# Patient Record
Sex: Female | Born: 1975
Health system: Southern US, Community
[De-identification: ages and names within clinical notes are randomized; demographics above are authoritative.]

## PROBLEM LIST (undated history)

## (undated) DIAGNOSIS — L719 Rosacea, unspecified: Secondary | ICD-10-CM

## (undated) DIAGNOSIS — F419 Anxiety disorder, unspecified: Secondary | ICD-10-CM

## (undated) DIAGNOSIS — F329 Major depressive disorder, single episode, unspecified: Secondary | ICD-10-CM

## (undated) HISTORY — DX: Rosacea, unspecified: L71.9

## (undated) HISTORY — DX: Anxiety disorder, unspecified: F41.9

## (undated) HISTORY — PX: WISDOM TOOTH EXTRACTION: SHX21

## (undated) HISTORY — DX: Major depressive disorder, single episode, unspecified: F32.9

---

## 2003-04-24 DIAGNOSIS — F419 Anxiety disorder, unspecified: Secondary | ICD-10-CM

## 2003-04-24 DIAGNOSIS — F32A Depression, unspecified: Secondary | ICD-10-CM

## 2003-04-24 HISTORY — DX: Anxiety disorder, unspecified: F41.9

## 2003-04-24 HISTORY — DX: Depression, unspecified: F32.A

## 2011-09-12 ENCOUNTER — Other Ambulatory Visit (HOSPITAL_COMMUNITY): Payer: Self-pay | Admitting: Specialist

## 2011-09-12 DIAGNOSIS — N949 Unspecified condition associated with female genital organs and menstrual cycle: Secondary | ICD-10-CM

## 2011-09-18 ENCOUNTER — Ambulatory Visit (HOSPITAL_COMMUNITY)
Admission: RE | Admit: 2011-09-18 | Discharge: 2011-09-18 | Disposition: A | Discharge status: Home / Self Care | Payer: BC Managed Care – PPO | Source: Ambulatory Visit | Attending: Specialist | Admitting: Specialist

## 2011-09-18 DIAGNOSIS — N979 Female infertility, unspecified: Secondary | ICD-10-CM | POA: Insufficient documentation

## 2011-09-18 DIAGNOSIS — N949 Unspecified condition associated with female genital organs and menstrual cycle: Secondary | ICD-10-CM

## 2011-09-18 MED ORDER — IOHEXOL 300 MG/ML  SOLN
4.0000 mL | Freq: Once | INTRAMUSCULAR | Status: AC | PRN
  Administered 2011-09-18: 4 mL

## 2012-10-06 ENCOUNTER — Ambulatory Visit (INDEPENDENT_AMBULATORY_CARE_PROVIDER_SITE_OTHER): Payer: BC Managed Care – PPO | Admitting: Gynecology

## 2012-10-06 ENCOUNTER — Encounter: Payer: Self-pay | Admitting: Gynecology

## 2012-10-06 VITALS — BP 112/74 | HR 78 | Resp 14 | Ht 64.0 in | Wt 121.0 lb

## 2012-10-06 DIAGNOSIS — Z309 Encounter for contraceptive management, unspecified: Secondary | ICD-10-CM

## 2012-10-06 DIAGNOSIS — Z124 Encounter for screening for malignant neoplasm of cervix: Secondary | ICD-10-CM

## 2012-10-06 DIAGNOSIS — Z01419 Encounter for gynecological examination (general) (routine) without abnormal findings: Secondary | ICD-10-CM

## 2012-10-06 MED ORDER — ESTRADIOL VALERATE-DIENOGEST 3/2-2/2-3/1 MG PO TABS: 1.0000 | ORAL_TABLET | Freq: Every day | ORAL | Status: DC

## 2012-10-06 NOTE — Patient Instructions (Signed)

## 2012-10-06 NOTE — Progress Notes (Signed)
37 y.o. Married Caucasian female   G0P0000 here for annual exam. Pt is currently sexually active.  Pt reports history of female factor infertility, is on oc to control dysmenorrhea and acne, happy with current ocp-on for 4y.  Patient's last menstrual period was 09/26/2012.          Sexually active: yes  The current method of family planning is OCP (estrogen/progesterone).    Exercising: yes  waking qd; aerobics 3-5x/wk Last pap: Summer 2013 Abnormal: Last Summer might be ASCUS? Alcohol: 4 drinks/wk  Tobacco: no BSE: tries to    No health maintenance topics applied.  Family History  Problem Relation Age of Onset  . Hypertension Mother   . Thyroid disease Mother   . Heart attack Father   . Osteoporosis Maternal Grandmother     There are no active problems to display for this patient.   Past Medical History  Diagnosis Date  . Anxiety   . Depression     Past Surgical History  Procedure Laterality Date  . Wisdom tooth extraction      college    Allergies: Doxycycline  Current Outpatient Prescriptions  Medication Sig Dispense Refill  . ampicillin (PRINCIPEN) 500 MG capsule Take 500 mg by mouth 4 (four) times daily.      Marland Kitchen ipratropium (ATROVENT) 0.06 % nasal spray Place 2 sprays into the nose 4 (four) times daily.      Marland Kitchen levocetirizine (XYZAL) 5 MG tablet Take 5 mg by mouth every evening.      . montelukast (SINGULAIR) 10 MG tablet Take 10 mg by mouth at bedtime.      . nitrofurantoin (MACRODANTIN) 50 MG capsule Take 50 mg by mouth as needed.      . triamcinolone (NASACORT) 55 MCG/ACT nasal inhaler Place 2 sprays into the nose daily.      Marland Kitchen UNABLE TO FIND once a week. Med Name: allergy shots       No current facility-administered medications for this visit.    ROS: Pertinent items are noted in HPI.  Exam:    BP 112/74  Pulse 78  Resp 14  Ht 5\' 4"  (1.626 m)  Wt 121 lb (54.885 kg)  BMI 20.76 kg/m2  LMP 09/26/2012 Weight change: @WEIGHTCHANGE @ Last 3 height  recordings:  Ht Readings from Last 3 Encounters:  10/06/12 5\' 4"  (1.626 m)   General appearance: alert, cooperative and appears stated age Head: Normocephalic, without obvious abnormality, atraumatic Neck: no adenopathy, no carotid bruit, no JVD, supple, symmetrical, trachea midline and thyroid not enlarged, symmetric, no tenderness/mass/nodules Lungs: clear to auscultation bilaterally Breasts: normal appearance, no masses or tenderness Heart: regular rate and rhythm, S1, S2 normal, no murmur, click, rub or gallop Abdomen: soft, non-tender; bowel sounds normal; no masses,  no organomegaly Extremities: extremities normal, atraumatic, no cyanosis or edema Skin: Skin color, texture, turgor normal. No rashes or lesions Lymph nodes: Cervical, supraclavicular, and axillary nodes normal. no inguinal nodes palpated Neurologic: Grossly normal   Pelvic: External genitalia:  no lesions              Urethra: normal appearing urethra with no masses, tenderness or lesions              Bartholins and Skenes: Bartholin's, Urethra, Skene's normal                 Vagina: normal appearing vagina with normal color and discharge, no lesions  Cervix: normal appearance              Pap taken: yes        Bimanual Exam:  Uterus:  uterus is normal size, shape, consistency and nontender                                      Adnexa:    normal adnexa in size, nontender and no masses                                      Rectovaginal: Confirms                                      Anus:  normal sphincter tone, no lesions  A: well woman     P: mammogram baseline at 41 pap smear guidelines reviewed return annually or prn   An After Visit Summary was printed and given to the patient.

## 2013-02-26 ENCOUNTER — Other Ambulatory Visit: Payer: Self-pay

## 2013-07-28 ENCOUNTER — Telehealth: Payer: Self-pay | Admitting: Gynecology

## 2013-07-28 ENCOUNTER — Encounter: Payer: Self-pay | Admitting: Gynecology

## 2013-07-28 ENCOUNTER — Ambulatory Visit (INDEPENDENT_AMBULATORY_CARE_PROVIDER_SITE_OTHER): Payer: BC Managed Care – PPO | Admitting: Gynecology

## 2013-07-28 VITALS — BP 110/76 | Resp 18 | Ht 64.0 in | Wt 118.0 lb

## 2013-07-28 DIAGNOSIS — R590 Localized enlarged lymph nodes: Secondary | ICD-10-CM

## 2013-07-28 DIAGNOSIS — R599 Enlarged lymph nodes, unspecified: Secondary | ICD-10-CM

## 2013-07-28 NOTE — Telephone Encounter (Signed)
Patient found a breast lump and would like appointment ASAP.

## 2013-07-28 NOTE — Telephone Encounter (Signed)
Spoke with patient. Patient states that she noticed a small lump that is about the size of a pea under her left axilla. First noticed around a month ago and is concerned since it has not gone away. States it has not gotten any larger or smaller within the month. Denies tenderness and pain in or around area. Offered 3 appointment times today with Dr.Lathrop but patient declines stating that she is really busy with work today and lives out of town. Requesting appointment tomorrow morning. Advised Dr.Lathrop will be in surgery Wednesday morning. Appointment made for 9:30 on 4/8 with Dr.Silva. Patient agreeable and verbalizes understanding.  Routing to provider for final review. Patient agreeable to disposition. Will close encounter

## 2013-07-28 NOTE — Telephone Encounter (Signed)
Message left to return call to Amanda Stanley at [phone].    

## 2013-07-28 NOTE — Progress Notes (Signed)
Subjective:     Patient ID: Amanda WaltersArlie Stanley, female   DOB: 1975/05/23, 38 y.o.   MRN: [id number]  HPI Comments: Pt reports lump in left axilla that she noticed aobut 9237m ago when she was shaving.  Pt reports doing breast exams routinely and has not noticed anything in the breast.  She denies any recent URTI or infection on left.  Overall, she feels well.    Review of Systems Per HPI    Objective:   Physical Exam  Constitutional: She is oriented to person, place, and time. She appears well-developed and well-nourished.  Pulmonary/Chest: Right breast exhibits no mass, no nipple discharge, no skin change and no tenderness. Left breast exhibits no inverted nipple, no mass, no nipple discharge, no skin change and no tenderness. Breasts are symmetrical.    Neurological: She is alert and oriented to person, place, and time.       Assessment:     Left axillary LN     Plan:     Pt assured re size and mobility Pt states that she has been getting allergy shots in left upper arm recently for seasonal allergy Will reassess at annual due 6/15  Agrees to plan

## 2013-07-29 ENCOUNTER — Ambulatory Visit: Payer: Self-pay | Admitting: Obstetrics and Gynecology

## 2013-10-14 ENCOUNTER — Other Ambulatory Visit: Payer: Self-pay | Admitting: Gynecology

## 2013-10-14 NOTE — Telephone Encounter (Signed)
[facility] on NATAZIA Last filled - 10/06/12, #1 X 11 Last AEX - 10/06/12 Next AEX - 10/27/13 RX sent until AEX.

## 2013-10-27 ENCOUNTER — Ambulatory Visit (INDEPENDENT_AMBULATORY_CARE_PROVIDER_SITE_OTHER): Payer: BC Managed Care – PPO | Admitting: Gynecology

## 2013-10-27 ENCOUNTER — Encounter: Payer: Self-pay | Admitting: Gynecology

## 2013-10-27 VITALS — BP 100/60 | HR 66 | Resp 12 | Ht 64.0 in | Wt 118.0 lb

## 2013-10-27 DIAGNOSIS — Z3041 Encounter for surveillance of contraceptive pills: Secondary | ICD-10-CM

## 2013-10-27 DIAGNOSIS — Z01419 Encounter for gynecological examination (general) (routine) without abnormal findings: Secondary | ICD-10-CM

## 2013-10-27 MED ORDER — ESTRADIOL VALERATE-DIENOGEST 3/2-2/2-3/1 MG PO TABS: 1.0000 | ORAL_TABLET | Freq: Every day | ORAL | Status: DC

## 2013-10-27 NOTE — Progress Notes (Signed)
38 y.o. Married Caucasian female   G0P0000 here for annual exam. Pt is currently sexually active.  On ocp for regulation of cycle, history of female factor infertility.  Pt happy with ocp.  On macrodantin for post-coital cystitis-[facility].  LN in axilla resolved. No new complaints  Patient's last menstrual period was 10/26/2013.          Sexually active: Yes.    The current method of family planning is OCP (estrogen/progesterone).    Exercising: Yes.    yoga,spin, zumba, swim, step, aerobics qd  Last pap: 10/06/12 NEG HR HPV  Alcohol: 5-6 Drinks/wk Tobacco: no BSE: no  Hgb: Declined ; Urine: Declined    Health Maintenance  Topic Date Due  . Tetanus/tdap  04/05/1996  . Influenza Vaccine  11/21/2013  . Pap Smear  10/07/2015    Family History  Problem Relation Age of Onset  . Hypertension Mother   . Thyroid disease Mother   . Heart attack Father   . Osteoporosis Maternal Grandmother     There are no active problems to display for this patient.   Past Medical History  Diagnosis Date  . Anxiety   . Depression     Past Surgical History  Procedure Laterality Date  . Wisdom tooth extraction      college    Allergies: Doxycycline  Current Outpatient Prescriptions  Medication Sig Dispense Refill  . ipratropium (ATROVENT) 0.06 % nasal spray Place 2 sprays into the nose as needed.       Marland Kitchen. levocetirizine (XYZAL) 5 MG tablet Take 5 mg by mouth every evening.      . montelukast (SINGULAIR) 10 MG tablet Take 10 mg by mouth as needed.       Marland Kitchen. NATAZIA 3/2-2/2-3/1 MG tablet TAKE ONE TABLET BY MOUTH ONE TIME DAILY   28 tablet  0  . nitrofurantoin (MACRODANTIN) 50 MG capsule Take 50 mg by mouth as needed.      Marland Kitchen. UNABLE TO FIND once a week. Med Name: allergy shots       No current facility-administered medications for this visit.    ROS: Pertinent items are noted in HPI.  Exam:    BP 100/60  Pulse 66  Resp 12  Ht 5\' 4"  (1.626 m)  Wt 118 lb (53.524 kg)   BMI 20.24 kg/m2  LMP 10/26/2013 Weight change: @WEIGHTCHANGE @ Last 3 height recordings:  Ht Readings from Last 3 Encounters:  10/27/13 5\' 4"  (1.626 m)  07/28/13 5\' 4"  (1.626 m)  10/06/12 5\' 4"  (1.626 m)   General appearance: alert, cooperative and appears stated age Head: Normocephalic, without obvious abnormality, atraumatic Neck: no adenopathy, no carotid bruit, no JVD, supple, symmetrical, trachea midline and thyroid not enlarged, symmetric, no tenderness/mass/nodules Lungs: clear to auscultation bilaterally Breasts: normal appearance, no masses or tenderness Heart: regular rate and rhythm, S1, S2 normal, no murmur, click, rub or gallop Abdomen: soft, non-tender; bowel sounds normal; no masses,  no organomegaly Extremities: extremities normal, atraumatic, no cyanosis or edema Skin: Skin color, texture, turgor normal. No rashes or lesions Lymph nodes: Cervical, supraclavicular, and axillary nodes normal. no inguinal nodes palpated Neurologic: Grossly normal   Pelvic: External genitalia:  no lesions              Urethra: normal appearing urethra with no masses, tenderness or lesions              Bartholins and Skenes: Bartholin's, Urethra, Skene's normal  Vagina: normal appearing vagina with normal color and discharge, no lesions              Cervix: normal appearance              Pap taken: No.        Bimanual Exam:  Uterus:  uterus is normal size, shape, consistency and nontender                                      Adnexa:    normal adnexa in size, nontender and no masses                                      Rectovaginal: Confirms                                      Anus:  normal sphincter tone, no lesions      1. Routine gynecological examination   pap smear guidelines reviewed counseled on breast self exam, mammography screening, adequate intake of calcium and vitamin D, diet and exercise return annually or prn    2. Encounter for surveillance of  contraceptive pills  - Estradiol Valerate-Dienogest (NATAZIA) 3/2-2/2-3/1 MG tablet; Take 1 tablet by mouth daily.  Dispense: 3 Package; Refill: 3   An After Visit Summary was printed and given to the patient.

## 2014-04-20 ENCOUNTER — Telehealth: Payer: Self-pay | Admitting: Obstetrics and Gynecology

## 2014-04-20 DIAGNOSIS — Z3041 Encounter for surveillance of contraceptive pills: Secondary | ICD-10-CM

## 2014-04-20 MED ORDER — ESTRADIOL VALERATE-DIENOGEST 3/2-2/2-3/1 MG PO TABS: 1.0000 | ORAL_TABLET | Freq: Every day | ORAL | Status: DC

## 2014-04-20 NOTE — Telephone Encounter (Addendum)
Pt is requesting a 3 month supply for Natazia  to be sent to [facility] [phone]

## 2014-04-20 NOTE — Telephone Encounter (Signed)
Patient was last seen for annual on 7/715 with Dr.Lathrop. Transferred rx for Natazia #3 1RF to [facility] for patient. Left detailed message at [phone] okay per ROI. Advised patient rx has been transferred to [facility] may clal with any further questions.  Routing to Dr.Miller as previous Dr.Lathrop patient  Routing to provider for final review. Patient agreeable to disposition. Will close encounter

## 2014-08-20 ENCOUNTER — Other Ambulatory Visit: Payer: Self-pay | Admitting: Obstetrics & Gynecology

## 2014-08-20 NOTE — Telephone Encounter (Signed)
Medication refill request: Natazia Last AEX:  10/27/13 TL Next AEX: 10/18/14 PG Last MMG (if hormonal medication request): none Refill authorized: 04/20/14 #3packs/1R. Today #3pack/0R?

## 2014-10-18 ENCOUNTER — Encounter: Payer: Self-pay | Admitting: Nurse Practitioner

## 2014-10-18 ENCOUNTER — Ambulatory Visit (INDEPENDENT_AMBULATORY_CARE_PROVIDER_SITE_OTHER): Payer: PRIVATE HEALTH INSURANCE | Admitting: Nurse Practitioner

## 2014-10-18 VITALS — BP 118/64 | HR 82 | Ht 64.25 in | Wt 123.2 lb

## 2014-10-18 DIAGNOSIS — Z0000 Encounter for general adult medical examination without abnormal findings: Secondary | ICD-10-CM | POA: Diagnosis not present

## 2014-10-18 DIAGNOSIS — Z01419 Encounter for gynecological examination (general) (routine) without abnormal findings: Secondary | ICD-10-CM

## 2014-10-18 LAB — POCT URINALYSIS DIPSTICK
Leukocytes, UA: NEGATIVE
PH UA: 5
UROBILINOGEN UA: NEGATIVE

## 2014-10-18 LAB — HEMOGLOBIN, FINGERSTICK: HEMOGLOBIN, FINGERSTICK: 13.1 g/dL (ref 12.0–16.0)

## 2014-10-18 MED ORDER — ESTRADIOL VALERATE-DIENOGEST 3/2-2/2-3/1 MG PO TABS: 1.0000 | ORAL_TABLET | Freq: Every day | ORAL | Status: DC

## 2014-10-18 NOTE — Progress Notes (Signed)
39 y.o. G0 Married  Caucasian Fe here for annual exam.  She saw urologist in the spring and will continue with Macrobid PC.   Menses now very rare with only spotting for 1-2 days about every 6 months. On OCP for menses regulation.  Husband with infertility issues and all test has been inconclusive as to why.  They are not going to pursue any further evaluations or further invitro.  Patient's last menstrual period was 09/17/2014.          Sexually active: Yes.    The current method of family planning is OCP (estrogen/progesterone) and husband with infertility.   Exercising: Yes.    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 Labs: Hgb: 13.1 ; Urine: Negative     reports that she has never smoked. She has never used smokeless tobacco. She reports that she drinks about 2.4 oz of alcohol per week. She reports that she does not use illicit drugs.  Past Medical History  Diagnosis Date  . Depression 2005    no meds in 10 years  . Anxiety 2005    no meds in 10 years    Past Surgical History  Procedure Laterality Date  . Wisdom tooth extraction      college    Current Outpatient Prescriptions  Medication Sig Dispense Refill  . Estradiol Valerate-Dienogest (NATAZIA) 3/2-2/2-3/1 MG tablet Take 1 tablet by mouth daily. 3 Package 4  . ipratropium (ATROVENT) 0.06 % nasal spray Place 2 sprays into the nose as needed.     Marland Kitchen levocetirizine (XYZAL) 5 MG tablet Take 5 mg by mouth every evening.    . montelukast (SINGULAIR) 10 MG tablet Take 10 mg by mouth as needed.     . nitrofurantoin (MACRODANTIN) 50 MG capsule Take 50 mg by mouth as needed.    Marland Kitchen UNABLE TO FIND every 30 (thirty) days. Med Name: allergy shots     No current facility-administered medications for this visit.    Family History  Problem Relation Age of Onset  . Hypertension Mother   .  Thyroid disease Mother   . Hyperlipidemia Mother   . Heart attack Father   . Osteoporosis Maternal Grandmother   . Stroke Paternal Grandmother   . Stroke Paternal Grandfather     ROS:  Pertinent items are noted in HPI.  Otherwise, a comprehensive ROS was negative.  Exam:   BP 118/64 mmHg  Pulse 82  Ht 5' 4.25" (1.632 m)  Wt 123 lb 3.2 oz (55.883 kg)  BMI 20.98 kg/m2  LMP 09/17/2014 Height: 5' 4.25" (163.2 cm) Ht Readings from Last 3 Encounters:  10/18/14 5' 4.25" (1.632 m)  10/27/13  (1.626 m)  07/28/13  (1.626 m)    General appearance: alert, cooperative and appears stated age Head: Normocephalic, without obvious abnormality, atraumatic Neck: no adenopathy, supple, symmetrical, trachea midline and thyroid normal to inspection and palpation Lungs: clear to auscultation bilaterally Breasts: normal appearance, no masses or tenderness Heart: regular rate and rhythm Abdomen: soft, non-tender; no masses,  no organomegaly Extremities: extremities normal, atraumatic, no cyanosis or edema Skin: Skin color, texture, turgor normal. No rashes or lesions Lymph nodes: Cervical, supraclavicular, and axillary nodes normal. No abnormal inguinal nodes palpated Neurologic: Grossly normal   Pelvic: External genitalia:  no lesions  Urethra:  normal appearing urethra with no masses, tenderness or lesions              Bartholin's and Skene's: normal                 Vagina: normal appearing vagina with normal color and discharge, no lesions              Cervix: anteverted              Pap taken: No. Bimanual Exam:  Uterus:  normal size, contour, position, consistency, mobility, non-tender              Adnexa: no mass, fullness, tenderness               Rectovaginal: Confirms               Anus:  normal sphincter tone, no lesions  Chaperone present: No  A:  Well Woman with normal exam  OCP for menses regulation  Husband with infertility issues  Post coital UTI -  sees Urologist for this  P:   Reviewed health and wellness pertinent to exam  Pap smear as above  Refill on OCP for a year  She is to check with PCP and school to verify if TDap has been given.  Counseled on breast self exam, mammography screening, adequate intake of calcium and vitamin D, diet and exercise return annually or prn  An After Visit Summary was printed and given to the patient.

## 2014-10-18 NOTE — Patient Instructions (Addendum)

## 2014-10-21 NOTE — Progress Notes (Signed)
Encounter reviewed by Dr. Brook Amundson C. Silva.  

## 2015-09-22 HISTORY — PX: CHOLECYSTECTOMY: SHX55

## 2015-11-03 ENCOUNTER — Ambulatory Visit (INDEPENDENT_AMBULATORY_CARE_PROVIDER_SITE_OTHER): Payer: PRIVATE HEALTH INSURANCE | Admitting: Obstetrics and Gynecology

## 2015-11-03 ENCOUNTER — Encounter: Payer: Self-pay | Admitting: Obstetrics and Gynecology

## 2015-11-03 VITALS — BP 102/68 | HR 80 | Resp 14 | Ht 64.5 in | Wt 120.0 lb

## 2015-11-03 DIAGNOSIS — Z3041 Encounter for surveillance of contraceptive pills: Secondary | ICD-10-CM | POA: Diagnosis not present

## 2015-11-03 DIAGNOSIS — R6882 Decreased libido: Secondary | ICD-10-CM | POA: Diagnosis not present

## 2015-11-03 DIAGNOSIS — R232 Flushing: Secondary | ICD-10-CM

## 2015-11-03 DIAGNOSIS — N951 Menopausal and female climacteric states: Secondary | ICD-10-CM | POA: Diagnosis not present

## 2015-11-03 DIAGNOSIS — N898 Other specified noninflammatory disorders of vagina: Secondary | ICD-10-CM

## 2015-11-03 DIAGNOSIS — Z01419 Encounter for gynecological examination (general) (routine) without abnormal findings: Secondary | ICD-10-CM

## 2015-11-03 DIAGNOSIS — Z124 Encounter for screening for malignant neoplasm of cervix: Secondary | ICD-10-CM

## 2015-11-03 MED ORDER — ESTRADIOL VALERATE-DIENOGEST 3/2-2/2-3/1 MG PO TABS: 1.0000 | ORAL_TABLET | Freq: Every day | ORAL | Status: DC

## 2015-11-03 MED ORDER — NITROFURANTOIN MACROCRYSTAL 50 MG PO CAPS: ORAL_CAPSULE | ORAL | Status: DC

## 2015-11-03 NOTE — Patient Instructions (Signed)

## 2015-11-03 NOTE — Progress Notes (Signed)
40 y.o. G0P0 MarriedCaucasianF here for annual exam. She c/o vaginal dryness, more bothersome with intercourse, but notices it even without. Symptoms have been present for a few years. She is having hot flashes and night sweats, worse in the winter with the heat on and prior to her cycle. She takes Teacher, early years/pre with intercourse.  On OCP's for cycle control. When she tries to go off OCP's she gets bloated, acne, gains weight. Cycles were regular.  She c/o low libido, able to orgasm.  Period Duration (Days): with her current OCP she doens't have a period often Period Pattern: (!) Irregular Menstrual Flow: Light Menstrual Control: Panty liner Dysmenorrhea: None  Patient's last menstrual period was 09/30/2015.          Sexually active: Yes.    The current method of family planning is OCP (estrogen/progesterone).    Exercising: Yes.    walking/yoga/ zumba/weights/ cycling  Smoker:  no  Health Maintenance: Pap:  10-06-12 WNL NEG HR HPV History of abnormal Pap:  Yes repeat PAP normal  MMG:  Never Colonoscopy:  Never BMD:   Never TDaP:  Unsure, declines Gardasil: N/A   reports that she has never smoked. She has never used smokeless tobacco. She reports that she drinks about 2.4 oz of alcohol per week. She reports that she does not use illicit drugs. 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  No children.   Past Medical History  Diagnosis Date  . Depression 2005    no meds in 10 years  . Anxiety 2005    no meds in 10 years    Past Surgical History  Procedure Laterality Date  . Wisdom tooth extraction      college  . Cholecystectomy  09/2015     Current Outpatient Prescriptions  Medication Sig Dispense Refill  . Estradiol Valerate-Dienogest (NATAZIA) 3/2-2/2-3/1 MG tablet Take 1 tablet by mouth daily. 3 Package 4  . ipratropium (ATROVENT) 0.06 % nasal spray Place 2 sprays into the nose as needed.     Marland Kitchen levocetirizine (XYZAL) 5 MG tablet Take 5 mg by mouth every evening.     . montelukast (SINGULAIR) 10 MG tablet Take 10 mg by mouth as needed.     . nitrofurantoin (MACRODANTIN) 50 MG capsule Take 50 mg by mouth as needed.     No current facility-administered medications for this visit.    Family History  Problem Relation Age of Onset  . Hypertension Mother   . Thyroid disease Mother   . Hyperlipidemia Mother   . Heart attack Father   . Osteoporosis Maternal Grandmother   . Stroke Paternal Grandmother   . Stroke Paternal Grandfather     Review of Systems  Constitutional: Negative.   HENT: Negative.   Eyes: Negative.   Respiratory: Negative.   Cardiovascular: Negative.   Gastrointestinal: Negative.   Endocrine: Negative.   Genitourinary: Negative.   Musculoskeletal: Negative.   Skin: Negative.   Allergic/Immunologic: Negative.   Neurological: Negative.   Psychiatric/Behavioral: Negative.     Exam:   BP 102/68 mmHg  Pulse 80  Resp 14  Ht 5' 4.5" (1.638 m)  Wt 120 lb (54.432 kg)  BMI 20.29 kg/m2  LMP 09/30/2015  Weight change: @ Height:   Height: 5' 4.5" (163.8 cm)  Ht Readings from Last 3 Encounters:  11/03/15 5' 4.5" (1.638 m)  10/18/14 5' 4.25" (1.632 m)  10/27/13  (1.626 m)    General appearance: alert, cooperative and appears stated age Head: Normocephalic, without  obvious abnormality, atraumatic Neck: no adenopathy, supple, symmetrical, trachea midline and thyroid normal to inspection and palpation Lungs: clear to auscultation bilaterally Breasts: normal appearance, no masses or tenderness Heart: regular rate and rhythm Abdomen: soft, non-tender; bowel sounds normal; no masses,  no organomegaly Extremities: extremities normal, atraumatic, no cyanosis or edema Skin: Skin color, texture, turgor normal. No rashes or lesions Lymph nodes: Cervical, supraclavicular, and axillary nodes normal. No abnormal inguinal nodes palpated Neurologic: Grossly normal   Pelvic: External genitalia:  no lesions               Urethra:  normal appearing urethra with no masses, tenderness or lesions              Bartholins and Skenes: normal                 Vagina: normal appearing vagina with normal color and discharge, no lesions. No significant atrophy              Cervix: no lesions               Bimanual Exam:  Uterus:  normal size, contour, position, consistency, mobility, non-tender              Adnexa: no mass, fullness, tenderness               Rectovaginal: Confirms               Anus:  normal sphincter tone, no lesions  Chaperone was present for exam.  A:  Well Woman with normal exam  Some c/o vaginal dryness, with and without intercourse normal exam  Intermittent vasomotor symptoms, not currently  Low libido, able to orgasm. Discussed etiologies of low libido    P:   Pap with hpv  Declines TDAP and labs  Discussed calcium and vit D  Try replens vaginal lubricant  Try coconut oil for lubrication  Consider trying a different pill, monophasic 30 mcg  Consider a trial off of OCP's  Reading information on libido given

## 2015-11-08 NOTE — Addendum Note (Signed)
Addended by: Tobi BastosJERTSON, Vergie Zahm E on: 11/08/2015 12:31 PM   Modules accepted: Orders

## 2015-11-11 LAB — IPS PAP TEST WITH HPV

## 2016-10-18 ENCOUNTER — Encounter: Payer: Self-pay | Admitting: Obstetrics and Gynecology

## 2016-10-18 ENCOUNTER — Ambulatory Visit (INDEPENDENT_AMBULATORY_CARE_PROVIDER_SITE_OTHER): Payer: BLUE CROSS/BLUE SHIELD | Admitting: Obstetrics and Gynecology

## 2016-10-18 VITALS — BP 122/58 | HR 84 | Resp 16 | Ht 64.5 in | Wt 120.0 lb

## 2016-10-18 DIAGNOSIS — Z3041 Encounter for surveillance of contraceptive pills: Secondary | ICD-10-CM

## 2016-10-18 DIAGNOSIS — Z01419 Encounter for gynecological examination (general) (routine) without abnormal findings: Secondary | ICD-10-CM

## 2016-10-18 MED ORDER — ESTRADIOL VALERATE-DIENOGEST 3/2-2/2-3/1 MG PO TABS: 1.0000 | ORAL_TABLET | Freq: Every day | ORAL | 4 refills | Status: DC

## 2016-10-18 MED ORDER — NITROFURANTOIN MACROCRYSTAL 50 MG PO CAPS: ORAL_CAPSULE | ORAL | 2 refills | Status: DC

## 2016-10-18 NOTE — Progress Notes (Signed)
41 y.o. G0P0 MarriedCaucasianF here for annual exam.  No regular cycles on OCP's. She still has issues with vaginal dryness. Helped some with lubricant. No dyspareunia. Using macrodantin with intercourse.  On OCP's for cycle symptoms (bloating, acne, weight gain).     No LMP recorded. Patient is not currently having periods (Reason: Oral contraceptives).          Sexually active: Yes.    The current method of family planning is OCP (estrogen/progesterone).    Exercising: Yes.    weights/ cardio/ walking/ yoga  Smoker:  no  Health Maintenance: Pap:  11-09-15 WNL NEG HR HPV 10-06-12 WNL NEG HR HPV  History of abnormal Pap:  Yes - repeat PAP- normal  MMG:  Never Colonoscopy:  Never BMD:   Never TDaP:  Unsure, declines Gardasil: N/A   reports that she has never smoked. She has never used smokeless tobacco. She reports that she drinks about 2.4 - 3.0 oz of alcohol per week . 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   Past Medical History:  Diagnosis Date  . Anxiety 2005   no meds in 10 years  . Depression 2005   no meds in 10 years    Past Surgical History:  Procedure Laterality Date  . CHOLECYSTECTOMY  09/2015   . WISDOM TOOTH EXTRACTION     college    Current Outpatient Prescriptions  Medication Sig Dispense Refill  . azelastine (OPTIVAR) 0.05 % ophthalmic solution INSTILL 1 DROP INTO BOTH EYES TWICE A DAY AS NEEDED  6  . Estradiol Valerate-Dienogest (NATAZIA) 3/2-2/2-3/1 MG tablet Take 1 tablet by mouth daily. 3 Package 4  . ipratropium (ATROVENT) 0.06 % nasal spray Place 2 sprays into the nose as needed.     Marland Kitchen levocetirizine (XYZAL) 5 MG tablet Take 5 mg by mouth every evening.    . montelukast (SINGULAIR) 10 MG tablet Take 10 mg by mouth as needed.     . nitrofurantoin (MACRODANTIN) 50 MG capsule Take 1 tab po prn intercourse 30 capsule 2   No current facility-administered medications for this visit.     Family History  Problem Relation Age of Onset   . Hypertension Mother   . Thyroid disease Mother   . Hyperlipidemia Mother   . Heart attack Father   . Osteoporosis Maternal Grandmother   . Stroke Paternal Grandmother   . Stroke Paternal Grandfather     Review of Systems  Constitutional: Negative.   HENT: Negative.   Eyes: Negative.   Respiratory: Negative.   Cardiovascular: Negative.   Gastrointestinal: Negative.   Endocrine: Negative.   Genitourinary: Negative.   Musculoskeletal: Negative.   Skin: Negative.   Allergic/Immunologic: Negative.   Neurological: Negative.   Psychiatric/Behavioral: Negative.     Exam:   BP (!) 122/58 (BP Location: Right Arm, Patient Position: Sitting, Cuff Size: Normal)   Pulse 84   Resp 16   Ht 5' 4.5" (1.638 m)   Wt 120 lb (54.4 kg)   BMI 20.28 kg/m   Weight change: @WEIGHTCHANGE @ Height:   Height: 5' 4.5" (163.8 cm)  Ht Readings from Last 3 Encounters:  10/18/16 5' 4.5" (1.638 m)  11/03/15 5' 4.5" (1.638 m)  10/18/14 5' 4.25" (1.632 m)    General appearance: alert, cooperative and appears stated age Head: Normocephalic, without obvious abnormality, atraumatic Neck: no adenopathy, supple, symmetrical, trachea midline and thyroid normal to inspection and palpation Lungs: clear to auscultation bilaterally Cardiovascular: regular rate and rhythm Breasts: normal appearance,  no masses or tenderness Abdomen: soft, non-tender; bowel sounds normal; no masses,  no organomegaly Extremities: extremities normal, atraumatic, no cyanosis or edema Skin: Skin color, texture, turgor normal. No rashes or lesions Lymph nodes: Cervical, supraclavicular, and axillary nodes normal. No abnormal inguinal nodes palpated Neurologic: Grossly normal   Pelvic: External genitalia:  no lesions              Urethra:  normal appearing urethra with no masses, tenderness or lesions              Bartholins and Skenes: normal                 Vagina: normal appearing vagina with normal color and discharge, no  lesions              Cervix: no lesions               Bimanual Exam:  Uterus:  normal size, contour, position, consistency, mobility, non-tender              Adnexa: no mass, fullness, tenderness               Rectovaginal: Confirms               Anus:  normal sphincter tone, no lesions  Chaperone was present for exam.  A:  Well Woman with normal exam  P:   No pap this year  Declines TDAP or screening labs  Mammogram after she turns 41  Discussed breast self exam  Discussed calcium and vit D intake  Continue OCP's  Continue macrodantin prophylaxis with intercourse

## 2016-10-18 NOTE — Patient Instructions (Signed)

## 2017-10-01 ENCOUNTER — Encounter: Payer: Self-pay | Admitting: Obstetrics and Gynecology

## 2017-10-09 ENCOUNTER — Other Ambulatory Visit (HOSPITAL_COMMUNITY): Payer: Self-pay | Admitting: Gastroenterology

## 2017-10-09 DIAGNOSIS — R1013 Epigastric pain: Secondary | ICD-10-CM

## 2017-10-14 ENCOUNTER — Ambulatory Visit (HOSPITAL_COMMUNITY)
Admission: RE | Admit: 2017-10-14 | Discharge: 2017-10-14 | Disposition: A | Discharge status: Home / Self Care | Payer: BLUE CROSS/BLUE SHIELD | Source: Ambulatory Visit | Attending: Gastroenterology | Admitting: Gastroenterology

## 2017-10-14 DIAGNOSIS — R1013 Epigastric pain: Secondary | ICD-10-CM | POA: Diagnosis not present

## 2017-10-28 NOTE — Progress Notes (Signed)
42 y.o. G0P0 MarriedCaucasianF here for annual exam.  Wants to discuss hot flashes. Hot flashes started a few years ago, only at night. Can wake up sweating. In the winter it was happening a couple of times a week. Currently better.   Sexually active, no pain. She has used macrodantin to use with intercourse. Infrequently active, will try not taking it and see how she does.    She had her gallbladder out a few years ago and has had issues with diarrhea since then. She was recently started on Cholestyramine and Prilosec.   Period Duration (Days): a few days of spotting Period Pattern: (!) Irregular(due to ocp) Menstrual Flow: Light Dysmenorrhea: (!) Mild Dysmenorrhea Symptoms: Cramping  Mostly she doesn't bleed, just occasional spotting. Taking them cyclically.   No LMP recorded (lmp unknown). (Menstrual status: Oral contraceptives).          Sexually active: Yes.    The current method of family planning is OCP (estrogen/progesterone).    Exercising: Yes.    cardio, swimming, aerobics Smoker:  no  Health Maintenance: Pap:  11-09-15 WNL NEG HR HPV 10-06-12 WNL NEG HR HPV  History of abnormal Pap:  Yes - repeat PAP- normal  MMG:  08/26/2017 Birads 2 normal Colonoscopy:  Never BMD:   Never TDaP:  Unsure, declines.  Gardasil: Never, discussed, declines    reports that she has never smoked. She has never used smokeless tobacco. She reports that she drinks about 2.4 - 3.0 oz of alcohol per week. 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    Past Medical History:  Diagnosis Date  . Anxiety 2005   no meds in 10 years  . Depression 2005   no meds in 10 years    Past Surgical History:  Procedure Laterality Date  . CHOLECYSTECTOMY  09/2015   . WISDOM TOOTH EXTRACTION     college    Current Outpatient Medications  Medication Sig Dispense Refill  . cholestyramine (QUESTRAN) 4 GM/DOSE powder Take 4 g by mouth daily.  1  . Estradiol Valerate-Dienogest (NATAZIA)  3/2-2/2-3/1 MG tablet Take 1 tablet by mouth daily. 3 Package 4  . ipratropium (ATROVENT) 0.06 % nasal spray Place 2 sprays into the nose as needed.     Marland Kitchen levocetirizine (XYZAL) 5 MG tablet Take 5 mg by mouth every evening.    . montelukast (SINGULAIR) 10 MG tablet Take 10 mg by mouth as needed.     . nitrofurantoin (MACRODANTIN) 50 MG capsule Take 1 tab po prn intercourse 30 capsule 2  . omeprazole (PRILOSEC) 20 MG capsule Take 20 capsules by mouth daily.  1  . spironolactone (ALDACTONE) 50 MG tablet Take 50 mg by mouth daily.     No current facility-administered medications for this visit.     Family History  Problem Relation Age of Onset  . Hypertension Mother   . Thyroid disease Mother   . Hyperlipidemia Mother   . Heart attack Father   . Osteoporosis Maternal Grandmother   . Stroke Paternal Grandmother   . Stroke Paternal Grandfather     Review of Systems  Constitutional: Negative.   HENT: Negative.   Eyes: Negative.   Respiratory: Negative.   Cardiovascular: Negative.   Gastrointestinal: Negative.   Endocrine: Negative.   Genitourinary: Negative.   Musculoskeletal: Negative.   Skin: Negative.   Allergic/Immunologic: Negative.   Neurological: Negative.   Hematological: Negative.   Psychiatric/Behavioral: Negative.     Exam:   BP 110/79 (  BP Location: Right Arm, Patient Position: Sitting)   Pulse 68   Ht 5' 4.57" (1.64 m)   Wt 114 lb 3.2 oz (51.8 kg)   LMP  (LMP Unknown) Comment: does not have a cycle on bc  BMI 19.26 kg/m   Weight change: @WEIGHTCHANGE @ Height:   Height: 5' 4.57" (164 cm)  Ht Readings from Last 3 Encounters:  10/30/17 5' 4.57" (1.64 m)  10/18/16 5' 4.5" (1.638 m)  11/03/15 5' 4.5" (1.638 m)    General appearance: alert, cooperative and appears stated age Head: Normocephalic, without obvious abnormality, atraumatic Neck: no adenopathy, supple, symmetrical, trachea midline and thyroid normal to inspection and palpation Lungs: clear to  auscultation bilaterally Cardiovascular: regular rate and rhythm Breasts: normal appearance, no masses or tenderness Abdomen: soft, non-tender; non distended,  no masses,  no organomegaly Extremities: extremities normal, atraumatic, no cyanosis or edema Skin: Skin color, texture, turgor normal. No rashes or lesions Lymph nodes: Cervical, supraclavicular, and axillary nodes normal. No abnormal inguinal nodes palpated Neurologic: Grossly normal   Pelvic: External genitalia:  no lesions              Urethra:  normal appearing urethra with no masses, tenderness or lesions              Bartholins and Skenes: normal                 Vagina: normal appearing vagina with normal color and discharge, no lesions              Cervix: no lesions               Bimanual Exam:  Uterus:  normal size, contour, position, consistency, mobility, non-tender and anteverted              Adnexa: no mass, fullness, tenderness               Rectovaginal: Confirms               Anus:  normal sphincter tone, no lesions  Chaperone was present for exam.  A:  Well Woman with normal exam  Night sweats  Contraception surveillance, doing well on OCP's    P:   Pap next year  Continue OCP's. Discussed timing of taking OCP's with cholestyramine.   Screening labs, TSH  Mammogram UTD  Discussed breast self exam  Discussed calcium and vit D intake  Declines TDAP and Gardasil

## 2017-10-30 ENCOUNTER — Other Ambulatory Visit: Payer: Self-pay

## 2017-10-30 ENCOUNTER — Encounter: Payer: Self-pay | Admitting: Obstetrics and Gynecology

## 2017-10-30 ENCOUNTER — Ambulatory Visit (INDEPENDENT_AMBULATORY_CARE_PROVIDER_SITE_OTHER): Payer: BLUE CROSS/BLUE SHIELD | Admitting: Obstetrics and Gynecology

## 2017-10-30 VITALS — BP 110/79 | HR 68 | Ht 64.57 in | Wt 114.2 lb

## 2017-10-30 DIAGNOSIS — Z0000 Encounter for general adult medical examination without abnormal findings: Secondary | ICD-10-CM | POA: Diagnosis not present

## 2017-10-30 DIAGNOSIS — Z01419 Encounter for gynecological examination (general) (routine) without abnormal findings: Secondary | ICD-10-CM

## 2017-10-30 DIAGNOSIS — R61 Generalized hyperhidrosis: Secondary | ICD-10-CM

## 2017-10-30 DIAGNOSIS — Z3041 Encounter for surveillance of contraceptive pills: Secondary | ICD-10-CM | POA: Diagnosis not present

## 2017-10-30 MED ORDER — ESTRADIOL VALERATE-DIENOGEST 3/2-2/2-3/1 MG PO TABS: 1.0000 | ORAL_TABLET | Freq: Every day | ORAL | 4 refills | Status: DC

## 2017-10-30 NOTE — Patient Instructions (Signed)
EXERCISE AND DIET:  We recommended that you start or continue a regular exercise program for good health. Regular exercise means any activity that makes your heart beat faster and makes you sweat.  We recommend exercising at least 30 minutes per day at least 3 days a week, preferably 4 or 5.  We also recommend a diet low in fat and sugar.  Inactivity, poor dietary choices and obesity can cause diabetes, heart attack, stroke, and kidney damage, among others.    ALCOHOL AND SMOKING:  Women should limit their alcohol intake to no more than 7 drinks/beers/glasses of wine (combined, not each!) per week. Moderation of alcohol intake to this level decreases your risk of breast cancer and liver damage. And of course, no recreational drugs are part of a healthy lifestyle.  And absolutely no smoking or even second hand smoke. Most people know smoking can cause heart and lung diseases, but did you know it also contributes to weakening of your bones? Aging of your skin?  Yellowing of your teeth and nails?  CALCIUM AND VITAMIN D:  Adequate intake of calcium and Vitamin D are recommended.  The recommendations for exact amounts of these supplements seem to change often, but generally speaking 600 mg of calcium (either carbonate or citrate) and 800 units of Vitamin D per day seems prudent. Certain women may benefit from higher intake of Vitamin D.  If you are among these women, your doctor will have told you during your visit.    PAP SMEARS:  Pap smears, to check for cervical cancer or precancers,  have traditionally been done yearly, although recent scientific advances have shown that most women can have pap smears less often.  However, every woman still should have a physical exam from her gynecologist every year. It will include a breast check, inspection of the vulva and vagina to check for abnormal growths or skin changes, a visual exam of the cervix, and then an exam to evaluate the size and shape of the uterus and  ovaries.  And after 40 years of age, a rectal exam is indicated to check for rectal cancers. We will also provide age appropriate advice regarding health maintenance, like when you should have certain vaccines, screening for sexually transmitted diseases, bone density testing, colonoscopy, mammograms, etc.   MAMMOGRAMS:  All women over 40 years old should have a yearly mammogram. Many facilities now offer a "3D" mammogram, which may cost around $50 extra out of pocket. If possible,  we recommend you accept the option to have the 3D mammogram performed.  It both reduces the number of women who will be called back for extra views which then turn out to be normal, and it is better than the routine mammogram at detecting truly abnormal areas.    COLONOSCOPY:  Colonoscopy to screen for colon cancer is recommended for all women at age 50.  We know, you hate the idea of the prep.  We agree, BUT, having colon cancer and not knowing it is worse!!  Colon cancer so often starts as a polyp that can be seen and removed at colonscopy, which can quite literally save your life!  And if your first colonoscopy is normal and you have no family history of colon cancer, most women don't have to have it again for 10 years.  Once every ten years, you can do something that may end up saving your life, right?  We will be happy to help you get it scheduled when you are ready.    Be sure to check your insurance coverage so you understand how much it will cost.  It may be covered as a preventative service at no cost, but you should check your particular policy.      Breast Self-Awareness Breast self-awareness means being familiar with how your breasts look and feel. It involves checking your breasts regularly and reporting any changes to your health care provider. Practicing breast self-awareness is important. A change in your breasts can be a sign of a serious medical problem. Being familiar with how your breasts look and feel allows  you to find any problems early, when treatment is more likely to be successful. All women should practice breast self-awareness, including women who have had breast implants. How to do a breast self-exam One way to learn what is normal for your breasts and whether your breasts are changing is to do a breast self-exam. To do a breast self-exam: Look for Changes  1. Remove all the clothing above your waist. 2. Stand in front of a mirror in a room with good lighting. 3. Put your hands on your hips. 4. Push your hands firmly downward. 5. Compare your breasts in the mirror. Look for differences between them (asymmetry), such as: ? Differences in shape. ? Differences in size. ? Puckers, dips, and bumps in one breast and not the other. 6. Look at each breast for changes in your skin, such as: ? Redness. ? Scaly areas. 7. Look for changes in your nipples, such as: ? Discharge. ? Bleeding. ? Dimpling. ? Redness. ? A change in position. Feel for Changes  Carefully feel your breasts for lumps and changes. It is best to do this while lying on your back on the floor and again while sitting or standing in the shower or tub with soapy water on your skin. Feel each breast in the following way:  Place the arm on the side of the breast you are examining above your head.  Feel your breast with the other hand.  Start in the nipple area and make  inch (2 cm) overlapping circles to feel your breast. Use the pads of your three middle fingers to do this. Apply light pressure, then medium pressure, then firm pressure. The light pressure will allow you to feel the tissue closest to the skin. The medium pressure will allow you to feel the tissue that is a little deeper. The firm pressure will allow you to feel the tissue close to the ribs.  Continue the overlapping circles, moving downward over the breast until you feel your ribs below your breast.  Move one finger-width toward the center of the body.  Continue to use the  inch (2 cm) overlapping circles to feel your breast as you move slowly up toward your collarbone.  Continue the up and down exam using all three pressures until you reach your armpit.  Write Down What You Find  Write down what is normal for each breast and any changes that you find. Keep a written record with breast changes or normal findings for each breast. By writing this information down, you do not need to depend only on memory for size, tenderness, or location. Write down where you are in your menstrual cycle, if you are still menstruating. If you are having trouble noticing differences in your breasts, do not get discouraged. With time you will become more familiar with the variations in your breasts and more comfortable with the exam. How often should I examine my breasts? Examine   your breasts every month. If you are breastfeeding, the best time to examine your breasts is after a feeding or after using a breast pump. If you menstruate, the best time to examine your breasts is 5-7 days after your period is over. During your period, your breasts are lumpier, and it may be more difficult to notice changes. When should I see my health care provider? See your health care provider if you notice:  A change in shape or size of your breasts or nipples.  A change in the skin of your breast or nipples, such as a reddened or scaly area.  Unusual discharge from your nipples.  A lump or thick area that was not there before.  Pain in your breasts.  Anything that concerns you.  This information is not intended to replace advice given to you by your health care provider. Make sure you discuss any questions you have with your health care provider. Document Released: 04/09/2005 Document Revised: 09/15/2015 Document Reviewed: 02/27/2015 Elsevier Interactive Patient Education  2018 [employer]  

## 2017-10-31 LAB — CBC
HEMOGLOBIN: 14 g/dL (ref 11.1–15.9)
Hematocrit: 42.1 % (ref 34.0–46.6)
MCH: 30.9 pg (ref 26.6–33.0)
MCHC: 33.3 g/dL (ref 31.5–35.7)
MCV: 93 fL (ref 79–97)
PLATELETS: 247 10*3/uL (ref 150–450)
RBC: 4.53 x10E6/uL (ref 3.77–5.28)
RDW: 13.2 % (ref 12.3–15.4)
WBC: 6.6 10*3/uL (ref 3.4–10.8)

## 2017-10-31 LAB — COMPREHENSIVE METABOLIC PANEL
A/G RATIO: 1.7 (ref 1.2–2.2)
ALBUMIN: 4.3 g/dL (ref 3.5–5.5)
ALT: 12 IU/L (ref 0–32)
AST: 15 IU/L (ref 0–40)
Alkaline Phosphatase: 53 IU/L (ref 39–117)
BILIRUBIN TOTAL: 1.2 mg/dL (ref 0.0–1.2)
BUN / CREAT RATIO: 12 (ref 9–23)
BUN: 12 mg/dL (ref 6–24)
CO2: 20 mmol/L (ref 20–29)
Calcium: 9.6 mg/dL (ref 8.7–10.2)
Chloride: 105 mmol/L (ref 96–106)
Creatinine, Ser: 1.03 mg/dL — ABNORMAL HIGH (ref 0.57–1.00)
GFR calc Af Amer: 79 mL/min/{1.73_m2} (ref >59)
GFR calc non Af Amer: 68 mL/min/{1.73_m2} (ref >59)
GLUCOSE: 93 mg/dL (ref 65–99)
Globulin, Total: 2.5 g/dL (ref 1.5–4.5)
Potassium: 4.3 mmol/L (ref 3.5–5.2)
Sodium: 142 mmol/L (ref 134–144)
Total Protein: 6.8 g/dL (ref 6.0–8.5)

## 2017-10-31 LAB — LIPID PANEL
CHOLESTEROL TOTAL: 143 mg/dL (ref 100–199)
Chol/HDL Ratio: 2.1 ratio (ref 0.0–4.4)
HDL: 69 mg/dL (ref >39)
LDL Calculated: 60 mg/dL (ref 0–99)
Triglycerides: 68 mg/dL (ref 0–149)
VLDL CHOLESTEROL CAL: 14 mg/dL (ref 5–40)

## 2017-10-31 LAB — TSH: TSH: 1.59 u[IU]/mL (ref 0.450–4.500)

## 2017-11-04 ENCOUNTER — Telehealth: Payer: Self-pay

## 2017-11-04 DIAGNOSIS — R7989 Other specified abnormal findings of blood chemistry: Secondary | ICD-10-CM

## 2017-11-04 NOTE — Telephone Encounter (Signed)
Spoke with patient. Results given. Patient verbalizes understanding. Lab recheck scheduled for 11/14/2017 at 2:30 pm. Patient is agreeable to date and time. Lab ordered.

## 2017-11-04 NOTE — Telephone Encounter (Signed)
-----   Message from Romualdo BolkJill Evelyn Jertson, MD sent at 11/01/2017  5:42 PM EDT ----- Please let the patient know that her creatinine is just over normal.The rest of her lab work was normal. Have her come back for a repeat creatinine, just hydrate well before her lab appointment

## 2017-11-14 ENCOUNTER — Other Ambulatory Visit (INDEPENDENT_AMBULATORY_CARE_PROVIDER_SITE_OTHER): Payer: BLUE CROSS/BLUE SHIELD

## 2017-11-14 DIAGNOSIS — R7989 Other specified abnormal findings of blood chemistry: Secondary | ICD-10-CM

## 2017-11-15 ENCOUNTER — Telehealth: Payer: Self-pay

## 2017-11-15 LAB — COMPREHENSIVE METABOLIC PANEL
ALBUMIN: 4.3 g/dL (ref 3.5–5.5)
ALK PHOS: 55 IU/L (ref 39–117)
ALT: 14 IU/L (ref 0–32)
AST: 16 IU/L (ref 0–40)
Albumin/Globulin Ratio: 2 (ref 1.2–2.2)
BUN / CREAT RATIO: 9 (ref 9–23)
BUN: 10 mg/dL (ref 6–24)
Bilirubin Total: 1.2 mg/dL (ref 0.0–1.2)
CALCIUM: 9.2 mg/dL (ref 8.7–10.2)
CO2: 24 mmol/L (ref 20–29)
CREATININE: 1.06 mg/dL — AB (ref 0.57–1.00)
Chloride: 107 mmol/L — ABNORMAL HIGH (ref 96–106)
GFR calc Af Amer: 76 mL/min/{1.73_m2} (ref >59)
GFR, EST NON AFRICAN AMERICAN: 66 mL/min/{1.73_m2} (ref >59)
GLOBULIN, TOTAL: 2.2 g/dL (ref 1.5–4.5)
GLUCOSE: 83 mg/dL (ref 65–99)
Potassium: 4.7 mmol/L (ref 3.5–5.2)
SODIUM: 142 mmol/L (ref 134–144)
TOTAL PROTEIN: 6.5 g/dL (ref 6.0–8.5)

## 2017-11-15 NOTE — Telephone Encounter (Signed)
-----   Message from Romualdo BolkJill Evelyn Jertson, MD sent at 11/15/2017 12:41 PM EDT ----- Please inform the patient that her creatinine is still very mildly elevated, with normal filtration rate. Please have her f/u with primary MD (please give her names if needed)

## 2017-11-19 NOTE — Telephone Encounter (Signed)
Spoke with patient. Results given. Patient verbalizes understanding. States that she would like to speak with her dermatologist as she was started on Spironolactone and was told this could alter her kidney function. Also wants to speak with her Urologist before proceeding with referral to PCP. Will return call.

## 2017-11-19 NOTE — Telephone Encounter (Signed)
Patient returning [name] call. °

## 2017-11-19 NOTE — Telephone Encounter (Signed)
Left message to call Darlisa Spruiell at [phone]. 

## 2017-11-25 NOTE — Telephone Encounter (Signed)
Spoke with patient. Patient states that she is still in the process of reaching out to her dermatologist. Declines referral to PCP. Will return call if she would like assistance with scheduling.  Routing to provider for final review. Patient agreeable to disposition. Will close encounter.

## 2018-04-29 ENCOUNTER — Other Ambulatory Visit: Payer: Self-pay

## 2018-04-29 NOTE — Telephone Encounter (Signed)
Tried calling patient regarding a prescription refill request we received. No answer, left message to call me back at [phone]

## 2018-04-30 MED ORDER — ESTRADIOL VALERATE-DIENOGEST 3/2-2/2-3/1 MG PO TABS: 1.0000 | ORAL_TABLET | Freq: Every day | ORAL | 2 refills | Status: DC

## 2018-04-30 NOTE — Telephone Encounter (Signed)
Patient called to see if the medical assistant was available. She requests a call back on her mobile number.

## 2018-04-30 NOTE — Telephone Encounter (Signed)
Patient returned call and requested a call back at her work number: [phone].  [employer] is her new insurer for medications. She needs a new Rx sent in for [place].

## 2018-04-30 NOTE — Telephone Encounter (Signed)
Medication refill request: [name] Last AEX:  10/30/17 JJ Next AEX: 11/12/18 Last MMG (if hormonal medication request): 08/26/2017 Birads 2 normal Refill authorized: Patient needs refills to go to new pharmacy that is through her insurance. Quantity and number of refills have been modified in the order to last patient until her AEX in July 2020. Please advise.

## 2018-10-13 ENCOUNTER — Other Ambulatory Visit: Payer: Self-pay | Admitting: Obstetrics and Gynecology

## 2018-10-13 DIAGNOSIS — Z1231 Encounter for screening mammogram for malignant neoplasm of breast: Secondary | ICD-10-CM

## 2018-10-14 ENCOUNTER — Other Ambulatory Visit: Payer: Self-pay

## 2018-10-14 ENCOUNTER — Ambulatory Visit
Admission: RE | Admit: 2018-10-14 | Discharge: 2018-10-14 | Disposition: A | Discharge status: Home / Self Care | Payer: BC Managed Care – PPO | Source: Ambulatory Visit | Attending: Obstetrics and Gynecology | Admitting: Obstetrics and Gynecology

## 2018-10-14 DIAGNOSIS — Z1231 Encounter for screening mammogram for malignant neoplasm of breast: Secondary | ICD-10-CM

## 2018-11-11 NOTE — Progress Notes (Signed)
43 y.o. G0P0 Married White or Caucasian Not Hispanic or Latino female here for annual exam.   No regular cycle with her OCP's, will occasionally have a brown d/c. No dyspareunia.     No LMP recorded. (Menstrual status: Oral contraceptives).          Sexually active: Yes.    The current method of family planning is OCP (estrogen/progesterone).    Exercising: Yes.    running, swim, spin, yoga, weights, walking Smoker:  no  Health Maintenance: Pap:11-09-15 WNL NEG HR HPV, 10-06-12 WNL NEG HR HPV History of abnormal Pap:Yes - repeat PAP- normal MMG:10/21/2018 Birads 1 negative Colonoscopy:Never GHW:EXHBZ TDaP:Unsure, declines.  Gardasil:Never   reports that she has never smoked. She has never used smokeless tobacco. She reports current alcohol use of about 4.0 - 5.0 standard drinks of alcohol per week. 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  Past Medical History:  Diagnosis Date  . Anxiety 2005   no meds in 10 years  . Depression 2005   no meds in 10 years  . Rosacea     Past Surgical History:  Procedure Laterality Date  . CHOLECYSTECTOMY  09/2015   . WISDOM TOOTH EXTRACTION     college    Current Outpatient Medications  Medication Sig Dispense Refill  . Estradiol Valerate-Dienogest (NATAZIA) 3/2-2/2-3/1 MG tablet Take 1 tablet by mouth daily. 3 Package 2  . ipratropium (ATROVENT) 0.06 % nasal spray Place 2 sprays into the nose as needed.     Marland Kitchen levocetirizine (XYZAL) 5 MG tablet Take 5 mg by mouth every evening.    . montelukast (SINGULAIR) 10 MG tablet Take 10 mg by mouth as needed.     Marland Kitchen spironolactone (ALDACTONE) 25 MG tablet Take 25 mg by mouth daily.    Marland Kitchen spironolactone (ALDACTONE) 50 MG tablet Take 50 mg by mouth daily.     No current facility-administered medications for this visit.   On spironolactone for acne.   Family History  Problem Relation Age of Onset  . Hypertension Mother   . Thyroid disease Mother   .  Hyperlipidemia Mother   . Heart attack Father   . Osteoporosis Maternal Grandmother   . Stroke Paternal Grandmother   . Stroke Paternal Grandfather     Review of Systems  Constitutional: Negative.   HENT: Negative.   Eyes: Negative.   Respiratory: Negative.   Cardiovascular: Negative.   Gastrointestinal: Negative.   Endocrine: Negative.   Genitourinary: Negative.   Musculoskeletal: Negative.   Skin: Negative.   Allergic/Immunologic: Negative.   Neurological: Negative.   Hematological: Negative.   Psychiatric/Behavioral: Negative.     Exam:   BP 110/78 (BP Location: Right Arm, Patient Position: Sitting, Cuff Size: Normal)   Pulse 76   Temp 98.5 F (36.9 C) (Skin)   Ht 5' 4.37" (1.635 m)   Wt 109 lb (49.4 kg)   BMI 18.50 kg/m   Weight change: @WEIGHTCHANGE @ Height:   Height: 5' 4.37" (163.5 cm)  Ht Readings from Last 3 Encounters:  11/12/18 5' 4.37" (1.635 m)  10/30/17 5' 4.57" (1.64 m)  10/18/16 5' 4.5" (1.638 m)    General appearance: alert, cooperative and appears stated age Head: Normocephalic, without obvious abnormality, atraumatic Neck: no adenopathy, supple, symmetrical, trachea midline and thyroid normal to inspection and palpation Lungs: clear to auscultation bilaterally Cardiovascular: regular rate and rhythm Breasts: normal appearance, no masses or tenderness Abdomen: soft, non-tender; non distended,  no masses,  no organomegaly Extremities:  extremities normal, atraumatic, no cyanosis or edema Skin: Skin color, texture, turgor normal. No rashes or lesions Lymph nodes: Cervical, supraclavicular, and axillary nodes normal. No abnormal inguinal nodes palpated Neurologic: Grossly normal   Pelvic: External genitalia:  no lesions              Urethra:  normal appearing urethra with no masses, tenderness or lesions              Bartholins and Skenes: normal                 Vagina: normal appearing vagina with normal color and discharge, no lesions               Cervix: no lesions               Bimanual Exam:  Uterus:  normal size, contour, position, consistency, mobility, non-tender and anteverted              Adnexa: no mass, fullness, tenderness               Rectovaginal: Confirms               Anus:  normal sphincter tone, no lesions  Chaperone was present for exam.  A:  Well Woman with normal exam  P:   Pap with hpv  Mammogram UTD  Discussed breast self exam  Discussed calcium and vit D intake  Continue OCP's  Declines blood work

## 2018-11-12 ENCOUNTER — Other Ambulatory Visit (HOSPITAL_COMMUNITY)
Admission: RE | Admit: 2018-11-12 | Discharge: 2018-11-12 | Disposition: A | Discharge status: Home / Self Care | Payer: BC Managed Care – PPO | Source: Ambulatory Visit | Attending: Obstetrics and Gynecology | Admitting: Obstetrics and Gynecology

## 2018-11-12 ENCOUNTER — Encounter: Payer: Self-pay | Admitting: Obstetrics and Gynecology

## 2018-11-12 ENCOUNTER — Other Ambulatory Visit: Payer: Self-pay

## 2018-11-12 ENCOUNTER — Ambulatory Visit (INDEPENDENT_AMBULATORY_CARE_PROVIDER_SITE_OTHER): Payer: BC Managed Care – PPO | Admitting: Obstetrics and Gynecology

## 2018-11-12 VITALS — BP 110/78 | HR 76 | Temp 98.5°F | Ht 64.37 in | Wt 109.0 lb

## 2018-11-12 DIAGNOSIS — Z01419 Encounter for gynecological examination (general) (routine) without abnormal findings: Secondary | ICD-10-CM | POA: Diagnosis not present

## 2018-11-12 DIAGNOSIS — Z3041 Encounter for surveillance of contraceptive pills: Secondary | ICD-10-CM

## 2018-11-12 DIAGNOSIS — Z124 Encounter for screening for malignant neoplasm of cervix: Secondary | ICD-10-CM | POA: Insufficient documentation

## 2018-11-12 MED ORDER — NATAZIA 3/2-2/2-3/1 MG PO TABS: 1.0000 | ORAL_TABLET | Freq: Every day | ORAL | 3 refills | Status: DC

## 2018-11-12 NOTE — Addendum Note (Signed)
Addended by: Dorothy Spark on: 11/12/2018 05:52 PM   Modules accepted: Orders

## 2018-11-12 NOTE — Patient Instructions (Signed)

## 2018-11-14 LAB — CYTOLOGY - PAP
Diagnosis: NEGATIVE
HPV: NOT DETECTED

## 2019-07-06 ENCOUNTER — Other Ambulatory Visit: Payer: Self-pay | Admitting: Obstetrics and Gynecology

## 2019-07-06 DIAGNOSIS — Z1231 Encounter for screening mammogram for malignant neoplasm of breast: Secondary | ICD-10-CM

## 2019-10-28 ENCOUNTER — Ambulatory Visit
Admission: RE | Admit: 2019-10-28 | Discharge: 2019-10-28 | Disposition: A | Discharge status: Home / Self Care | Payer: BC Managed Care – PPO | Source: Ambulatory Visit | Attending: Obstetrics and Gynecology | Admitting: Obstetrics and Gynecology

## 2019-10-28 ENCOUNTER — Other Ambulatory Visit: Payer: Self-pay

## 2019-10-28 DIAGNOSIS — Z1231 Encounter for screening mammogram for malignant neoplasm of breast: Secondary | ICD-10-CM

## 2019-11-15 ENCOUNTER — Other Ambulatory Visit: Payer: Self-pay | Admitting: Obstetrics and Gynecology

## 2019-11-16 NOTE — Telephone Encounter (Signed)
Medication refill request: Amanda Stanley  Last AEX:  11-12-2018 JJ  Next AEX: 01-07-20 Last MMG (if hormonal medication request): 10-28-19 density C/BIRADS 1 negative  Refill authorized: Today, please advise.   Medication pended for #84, 0RF. Please refill if appropriate.

## 2019-11-18 ENCOUNTER — Ambulatory Visit: Payer: BC Managed Care – PPO | Admitting: Obstetrics and Gynecology

## 2020-01-06 NOTE — Progress Notes (Signed)
44 y.o. G0P0 Married White or Caucasian Not Hispanic or Latino female here for annual exam.  She is on OCP's, just occasionally has a cycle, light spotting. Sexually active without pain.   Dermatology prescribes spironolactone.     No LMP recorded. (Menstrual status: Oral contraceptives).      She is under lots of stress. Work was so so stressfull that she had to resign.  Her employer is harassing her. She has been having migraines (no auras) and back pain with the stress of the job and trying to leave the job.        Sexually active: Yes.    The current method of family planning is oral progesterone-only contraceptive.    Exercising: Yes.    Running, walking , weights, Swimming, Spinning, Yoga  Smoker:  no  Health Maintenance: Pap:  11/12/18 Wnl Hr HPV Neg, 11-09-15 WNL NEG HR HPV History of abnormal Pap:  Yes repeat pap was normal.  MMG:  10/28/19 Density C Bi-rads 1 neg  BMD:   Never  Colonoscopy: Never  TDaP:  When she was a kid  Gardasil: Never   reports that she has never smoked. She has never used smokeless tobacco. She reports current alcohol use of about 4.0 - 5.0 standard drinks of alcohol per week. She reports that she does not use drugs. 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  Past Medical History:  Diagnosis Date  . Anxiety 2005   no meds in 10 years  . Depression 2005   no meds in 10 years  . Rosacea     Past Surgical History:  Procedure Laterality Date  . CHOLECYSTECTOMY  09/2015   . WISDOM TOOTH EXTRACTION     college    Current Outpatient Medications  Medication Sig Dispense Refill  . NATAZIA 3/2-2/2-3/1 MG tablet TAKE 1 TABLET DAILY 84 tablet 0  . spironolactone (ALDACTONE) 50 MG tablet Take 50 mg by mouth daily.    Marland Kitchen spironolactone (ALDACTONE) 100 MG tablet Take 100 mg by mouth daily.     No current facility-administered medications for this visit.    Family History  Problem Relation Age of Onset  .  Hypertension Mother   . Thyroid disease Mother   . Hyperlipidemia Mother   . Heart attack Father   . Osteoporosis Maternal Grandmother   . Stroke Paternal Grandmother   . Stroke Paternal Grandfather     Review of Systems  All other systems reviewed and are negative.   Exam:   BP 104/60   Pulse 85   Ht 5' 4.5" (1.638 m)   Wt 110 lb (49.9 kg)   SpO2 100%   BMI 18.59 kg/m   Weight change: @WEIGHTCHANGE @ Height:   Height: 5' 4.5" (163.8 cm)  Ht Readings from Last 3 Encounters:  01/07/20 5' 4.5" (1.638 m)  11/12/18 5' 4.37" (1.635 m)  10/30/17 5' 4.57" (1.64 m)    General appearance: alert, cooperative and appears stated age Head: Normocephalic, without obvious abnormality, atraumatic Neck: no adenopathy, supple, symmetrical, trachea midline and thyroid normal to inspection and palpation Lungs: clear to auscultation bilaterally Cardiovascular: regular rate and rhythm Breasts: normal appearance, no masses or tenderness Abdomen: soft, non-tender; non distended,  no masses,  no organomegaly Extremities: extremities normal, atraumatic, no cyanosis or edema Skin: Skin color, texture, turgor normal. No rashes or lesions Lymph nodes: Cervical, supraclavicular, and axillary nodes normal. No abnormal inguinal nodes palpated Neurologic: Grossly normal   Pelvic:  External genitalia:  no lesions              Urethra:  normal appearing urethra with no masses, tenderness or lesions              Bartholins and Skenes: normal                 Vagina: normal appearing vagina with normal color and discharge, no lesions              Cervix: no lesions               Bimanual Exam:  Uterus:  normal size, contour, position, consistency, mobility, non-tender              Adnexa: no mass, fullness, tenderness               Rectovaginal: Confirms               Anus:  normal sphincter tone, no lesions  Carolynn Serve chaperoned for the exam.  A:  Well Woman with normal exam  Situational stress.  Work stress was causing migraines and back pain.   P:   No pap this year  Mammogram UTD  Discussed breast self exam  Discussed calcium and vit D intake  Labs with Dermatology (she will make sure they are checking renal function). Declines other screening labs.

## 2020-01-07 ENCOUNTER — Ambulatory Visit (INDEPENDENT_AMBULATORY_CARE_PROVIDER_SITE_OTHER): Payer: BC Managed Care – PPO | Admitting: Obstetrics and Gynecology

## 2020-01-07 ENCOUNTER — Encounter: Payer: Self-pay | Admitting: Obstetrics and Gynecology

## 2020-01-07 ENCOUNTER — Other Ambulatory Visit: Payer: Self-pay

## 2020-01-07 VITALS — BP 104/60 | HR 85 | Ht 64.5 in | Wt 110.0 lb

## 2020-01-07 DIAGNOSIS — Z3041 Encounter for surveillance of contraceptive pills: Secondary | ICD-10-CM

## 2020-01-07 DIAGNOSIS — Z01419 Encounter for gynecological examination (general) (routine) without abnormal findings: Secondary | ICD-10-CM | POA: Diagnosis not present

## 2020-01-07 DIAGNOSIS — F439 Reaction to severe stress, unspecified: Secondary | ICD-10-CM | POA: Diagnosis not present

## 2020-01-07 MED ORDER — NATAZIA 3/2-2/2-3/1 MG PO TABS: 1.0000 | ORAL_TABLET | Freq: Every day | ORAL | 3 refills | Status: DC

## 2020-01-07 NOTE — Patient Instructions (Addendum)
EXERCISE AND DIET:  We recommended that you start or continue a regular exercise program for good health. Regular exercise means any activity that makes your heart beat faster and makes you sweat.  We recommend exercising at least 30 minutes per day at least 3 days a week, preferably 4 or 5.  We also recommend a diet low in fat and sugar.  Inactivity, poor dietary choices and obesity can cause diabetes, heart attack, stroke, and kidney damage, among others.    ALCOHOL AND SMOKING:  Women should limit their alcohol intake to no more than 7 drinks/beers/glasses of wine (combined, not each!) per week. Moderation of alcohol intake to this level decreases your risk of breast cancer and liver damage. And of course, no recreational drugs are part of a healthy lifestyle.  And absolutely no smoking or even second hand smoke. Most people know smoking can cause heart and lung diseases, but did you know it also contributes to weakening of your bones? Aging of your skin?  Yellowing of your teeth and nails?  CALCIUM AND VITAMIN D:  Adequate intake of calcium and Vitamin D are recommended.  The recommendations for exact amounts of these supplements seem to change often, but generally speaking 1,000 mg of calcium (between diet and supplement) and 800 units of Vitamin D per day seems prudent. Certain women may benefit from higher intake of Vitamin D.  If you are among these women, your doctor will have told you during your visit.    PAP SMEARS:  Pap smears, to check for cervical cancer or precancers,  have traditionally been done yearly, although recent scientific advances have shown that most women can have pap smears less often.  However, every woman still should have a physical exam from her gynecologist every year. It will include a breast check, inspection of the vulva and vagina to check for abnormal growths or skin changes, a visual exam of the cervix, and then an exam to evaluate the size and shape of the uterus and  ovaries.  And after 43 years of age, a rectal exam is indicated to check for rectal cancers. We will also provide age appropriate advice regarding health maintenance, like when you should have certain vaccines, screening for sexually transmitted diseases, bone density testing, colonoscopy, mammograms, etc.   MAMMOGRAMS:  All women over 19 years old should have a yearly mammogram. Many facilities now offer a "3D" mammogram, which may cost around $50 extra out of pocket. If possible,  we recommend you accept the option to have the 3D mammogram performed.  It both reduces the number of women who will be called back for extra views which then turn out to be normal, and it is better than the routine mammogram at detecting truly abnormal areas.    COLON CANCER SCREENING: Now recommend starting at age 47. At this time colonoscopy is not covered for routine screening until 50. There are take home tests that can be done between 45-49.   COLONOSCOPY:  Colonoscopy to screen for colon cancer is recommended for all women at age 65.  We know, you hate the idea of the prep.  We agree, BUT, having colon cancer and not knowing it is worse!!  Colon cancer so often starts as a polyp that can be seen and removed at colonscopy, which can quite literally save your life!  And if your first colonoscopy is normal and you have no family history of colon cancer, most women don't have to have it again for  10 years.  Once every ten years, you can do something that may end up saving your life, right?  We will be happy to help you get it scheduled when you are ready.  Be sure to check your insurance coverage so you understand how much it will cost.  It may be covered as a preventative service at no cost, but you should check your particular policy.      Breast Self-Awareness Breast self-awareness means being familiar with how your breasts look and feel. It involves checking your breasts regularly and reporting any changes to your  health care provider. Practicing breast self-awareness is important. A change in your breasts can be a sign of a serious medical problem. Being familiar with how your breasts look and feel allows you to find any problems early, when treatment is more likely to be successful. All women should practice breast self-awareness, including women who have had breast implants. How to do a breast self-exam One way to learn what is normal for your breasts and whether your breasts are changing is to do a breast self-exam. To do a breast self-exam: Look for Changes  1. Remove all the clothing above your waist. 2. Stand in front of a mirror in a room with good lighting. 3. Put your hands on your hips. 4. Push your hands firmly downward. 5. Compare your breasts in the mirror. Look for differences between them (asymmetry), such as: ? Differences in shape. ? Differences in size. ? Puckers, dips, and bumps in one breast and not the other. 6. Look at each breast for changes in your skin, such as: ? Redness. ? Scaly areas. 7. Look for changes in your nipples, such as: ? Discharge. ? Bleeding. ? Dimpling. ? Redness. ? A change in position. Feel for Changes Carefully feel your breasts for lumps and changes. It is best to do this while lying on your back on the floor and again while sitting or standing in the shower or tub with soapy water on your skin. Feel each breast in the following way:  Place the arm on the side of the breast you are examining above your head.  Feel your breast with the other hand.  Start in the nipple area and make  inch (2 cm) overlapping circles to feel your breast. Use the pads of your three middle fingers to do this. Apply light pressure, then medium pressure, then firm pressure. The light pressure will allow you to feel the tissue closest to the skin. The medium pressure will allow you to feel the tissue that is a little deeper. The firm pressure will allow you to feel the tissue  close to the ribs.  Continue the overlapping circles, moving downward over the breast until you feel your ribs below your breast.  Move one finger-width toward the center of the body. Continue to use the  inch (2 cm) overlapping circles to feel your breast as you move slowly up toward your collarbone.  Continue the up and down exam using all three pressures until you reach your armpit.  Write Down What You Find  Write down what is normal for each breast and any changes that you find. Keep a written record with breast changes or normal findings for each breast. By writing this information down, you do not need to depend only on memory for size, tenderness, or location. Write down where you are in your menstrual cycle, if you are still menstruating. If you are having trouble noticing differences   in your breasts, do not get discouraged. With time you will become more familiar with the variations in your breasts and more comfortable with the exam. How often should I examine my breasts? Examine your breasts every month. If you are breastfeeding, the best time to examine your breasts is after a feeding or after using a breast pump. If you menstruate, the best time to examine your breasts is 5-7 days after your period is over. During your period, your breasts are lumpier, and it may be more difficult to notice changes. When should I see my health care provider? See your health care provider if you notice:  A change in shape or size of your breasts or nipples.  A change in the skin of your breast or nipples, such as a reddened or scaly area.  Unusual discharge from your nipples.  A lump or thick area that was not there before.  Pain in your breasts.  Anything that concerns you.   Stress, Adult Stress is a normal reaction to life events. Stress is what you feel when life demands more than you are used to, or more than you think you can handle. Some stress can be useful, such as studying for a  test or meeting a deadline at work. Stress that occurs too often or for too long can cause problems. It can affect your emotional health and interfere with relationships and normal daily activities. Too much stress can weaken your body's defense system (immune system) and increase your risk for physical illness. If you already have a medical problem, stress can make it worse. What are the causes? All sorts of life events can cause stress. An event that causes stress for one person may not be stressful for another person. Major life events, whether positive or negative, commonly cause stress. Examples include:  Losing a job or starting a new job.  Losing a loved one.  Moving to a new town or home.  Getting married or divorced.  Having a baby.  Getting injured or sick. Less obvious life events can also cause stress, especially if they occur day after day or in combination with each other. Examples include:  Working long hours.  Driving in traffic.  Caring for children.  Being in debt.  Being in a difficult relationship. What are the signs or symptoms? Stress can cause emotional symptoms, including:  Anxiety. This is feeling worried, afraid, on edge, overwhelmed, or out of control.  Anger, including irritation or impatience.  Depression. This is feeling sad, down, helpless, or guilty.  Trouble focusing, remembering, or making decisions. Stress can cause physical symptoms, including:  Aches and pains. These may affect your head, neck, back, stomach, or other areas of your body.  Tight muscles or a clenched jaw.  Low energy.  Trouble sleeping. Stress can cause unhealthy behaviors, including:  Eating to feel better (overeating) or skipping meals.  Working too much or putting off tasks.  Smoking, drinking alcohol, or using drugs to feel better. How is this diagnosed? Stress is diagnosed through an assessment by your health care provider. He or she may diagnose this  condition based on:  Your symptoms and any stressful life events.  Your medical history.  Tests to rule out other causes of your symptoms. Depending on your condition, your health care provider may refer you to a specialist for further evaluation. How is this treated?  Stress management techniques are the recommended treatment for stress. Medicine is not typically recommended for the treatment of  stress. Techniques to reduce your reaction to stressful life events include:  Stress identification. Monitor yourself for symptoms of stress and identify what causes stress for you. These skills may help you to avoid or prepare for stressful events.  Time management. Set your priorities, keep a calendar of events, and learn to say no. Taking these actions can help you avoid making too many commitments. Techniques for coping with stress include:  Rethinking the problem. Try to think realistically about stressful events rather than ignoring them or overreacting. Try to find the positives in a stressful situation rather than focusing on the negatives.  Exercise. Physical exercise can release both physical and emotional tension. The key is to find a form of exercise that you enjoy and do it regularly.  Relaxation techniques. These relax the body and mind. The key is to find one or more that you enjoy and use the techniques regularly. Examples include: ? Meditation, deep breathing, or progressive relaxation techniques. ? Yoga or tai chi. ? Biofeedback, mindfulness techniques, or journaling. ? Listening to music, being out in nature, or participating in other hobbies.  Practicing a healthy lifestyle. Eat a balanced diet, drink plenty of water, limit or avoid caffeine, and get plenty of sleep.  Having a strong support network. Spend time with family, friends, or other people you enjoy being around. Express your feelings and talk things over with someone you trust. Counseling or talk therapy with a  mental health professional may be helpful if you are having trouble managing stress on your own. Follow these instructions at home: Lifestyle   Avoid drugs.  Do not use any products that contain nicotine or tobacco, such as cigarettes, e-cigarettes, and chewing tobacco. If you need help quitting, ask your health care provider.  Limit alcohol intake to no more than 1 drink a day for nonpregnant women and 2 drinks a day for men. One drink equals 12 oz of beer, 5 oz of wine, or 1 oz of hard liquor  Do not use alcohol or drugs to relax.  Eat a balanced diet that includes fresh fruits and vegetables, whole grains, lean meats, fish, eggs, and beans, and low-fat dairy. Avoid processed foods and foods high in added fat, sugar, and salt.  Exercise at least 30 minutes on 5 or more days each week.  Get 7-8 hours of sleep each night. General instructions   Practice stress management techniques as discussed with your health care provider.  Drink enough fluid to keep your urine clear or pale yellow.  Take over-the-counter and prescription medicines only as told by your health care provider.  Keep all follow-up visits as told by your health care provider. This is important. Contact a health care provider if:  Your symptoms get worse.  You have new symptoms.  You feel overwhelmed by your problems and can no longer manage them on your own. Get help right away if:  You have thoughts of hurting yourself or others. If you ever feel like you may hurt yourself or others, or have thoughts about taking your own life, get help right away. You can go to your nearest emergency department or call:  Your local emergency services (911 in the [place]  A suicide crisis helpline, such as the [facility] at 615-280-4545. This is open 24 hours a day. Summary  Stress is a normal reaction to life events. It can cause problems if it happens too often or for too long.  Practicing  stress management techniques is  the best way to treat stress.  Counseling or talk therapy with a mental health professional may be helpful if you are having trouble managing stress on your own. This information is not intended to replace advice given to you by your health care provider. Make sure you discuss any questions you have with your health care provider. Document Revised: 11/07/2018 Document Reviewed: 05/30/2016 Elsevier Patient Education  [employer].

## 2020-05-03 ENCOUNTER — Telehealth: Payer: Self-pay

## 2020-05-03 MED ORDER — NATAZIA 3/2-2/2-3/1 MG PO TABS: 1.0000 | ORAL_TABLET | Freq: Every day | ORAL | 2 refills | Status: DC

## 2020-05-03 NOTE — Telephone Encounter (Signed)
Left message she needs her bcp Rx sent to different pharmacy.

## 2020-06-06 ENCOUNTER — Telehealth: Payer: Self-pay | Admitting: *Deleted

## 2020-06-06 NOTE — Telephone Encounter (Signed)
Patient called stating her new insurance is no longer paying for Natazia 3/2-2/2-3/1 mg tablet. She spoke with [employer] and was told to call the providers office and submit a Coverage Exception. New OI#[id number] Group# [id number]. [phone] Form filled out via [employer]. Will wait for the response.

## 2020-06-15 ENCOUNTER — Encounter: Payer: Self-pay | Admitting: Obstetrics and Gynecology

## 2020-06-15 NOTE — Telephone Encounter (Signed)
I called and completed PA via phone with representative at listed number below, was told 23-72 hour turn around for response from insurance.

## 2020-06-28 NOTE — Telephone Encounter (Signed)
Natazia denied by insurance patient will need to try and fail ALL alternative medication such as Lo Loestrin FE, generic Mircette, generic Ortho tri-cyclen lo, generic Loestrin FE.

## 2020-07-06 DIAGNOSIS — L700 Acne vulgaris: Secondary | ICD-10-CM | POA: Diagnosis not present

## 2020-07-06 DIAGNOSIS — Z79899 Other long term (current) drug therapy: Secondary | ICD-10-CM | POA: Diagnosis not present

## 2020-10-19 DIAGNOSIS — L700 Acne vulgaris: Secondary | ICD-10-CM | POA: Diagnosis not present

## 2020-10-19 DIAGNOSIS — L821 Other seborrheic keratosis: Secondary | ICD-10-CM | POA: Diagnosis not present

## 2020-10-31 DIAGNOSIS — Z1283 Encounter for screening for malignant neoplasm of skin: Secondary | ICD-10-CM | POA: Diagnosis not present

## 2020-10-31 DIAGNOSIS — L820 Inflamed seborrheic keratosis: Secondary | ICD-10-CM | POA: Diagnosis not present

## 2020-10-31 DIAGNOSIS — Z719 Counseling, unspecified: Secondary | ICD-10-CM | POA: Diagnosis not present

## 2020-10-31 DIAGNOSIS — D225 Melanocytic nevi of trunk: Secondary | ICD-10-CM | POA: Diagnosis not present

## 2020-12-06 ENCOUNTER — Encounter: Payer: Self-pay | Admitting: Obstetrics and Gynecology

## 2020-12-06 DIAGNOSIS — Z1231 Encounter for screening mammogram for malignant neoplasm of breast: Secondary | ICD-10-CM | POA: Diagnosis not present

## 2021-01-09 NOTE — Progress Notes (Signed)
45 y.o. G0P0 Married White or Caucasian Not Hispanic or Latino female here for annual exam.  Mother had uterine cancer and started radiation this week.  She went off of OCP's in 3/22 (insurance wouldn't cover it). She had spotting 30-40 days after stopping the pill for 4-5 days. Since then she has had spotting every ~14-45 days for 1-2 x days. She didn't need a mini-pad. She has had some PMS symptoms. Only bleed in less than 3 weeks one or two time.  No vasomotor symptoms, no thyroid c/o, no galactorrhea.  She is not interested in starting a different OCP's. She has been on lots of OCP's over the years. The pill she was on was the only one that worked for her.  No dyspareunia.  Period Duration (Days): 1-2 Period Pattern: (!) Irregular Menstrual Flow: Light Menstrual Control: Panty liner Menstrual Control Change Freq (Hours): 1 Dysmenorrhea: (!) Mild Dysmenorrhea Symptoms: Cramping  Patient's last menstrual period was 12/22/2020.          Sexually active: Yes.    The current method of family planning is Husband is sterile     Exercising: Yes.     Cardio Yoga  Smoker:  no  Health Maintenance: Pap:  11/12/18 Wnl Hr HPV Neg,  11-09-15 WNL NEG HR HPV History of abnormal Pap:  Yes repeat pap was normal.  MMG:  12/06/20 bi-rads 1 neg  BMD:   never  Colonoscopy: never  TDaP:  not sure  Gardasil: none    reports that she has never smoked. She has never used smokeless tobacco. She reports current alcohol use of about 4.0 - 5.0 standard drinks per week. She reports that she does not use drugs. Not currently working, looking for work.   Past Medical History:  Diagnosis Date   Anxiety 2005   no meds in 10 years   Depression 2005   no meds in 10 years   Rosacea     Past Surgical History:  Procedure Laterality Date   CHOLECYSTECTOMY  09/2015    WISDOM TOOTH EXTRACTION     college    Current Outpatient Medications  Medication Sig Dispense Refill   spironolactone (ALDACTONE) 100 MG tablet  Take 100 mg by mouth daily.     spironolactone (ALDACTONE) 50 MG tablet Take 50 mg by mouth daily.     No current facility-administered medications for this visit.    Family History  Problem Relation Age of Onset   Hypertension Mother    Thyroid disease Mother    Hyperlipidemia Mother    Heart attack Father    Osteoporosis Maternal Grandmother    Stroke Paternal Grandmother    Stroke Paternal Grandfather     Review of Systems  Exam:   BP 110/60   Pulse 72   Ht 5' 4.5" (1.638 m)   Wt 109 lb (49.4 kg)   LMP 12/22/2020   SpO2 100%   BMI 18.42 kg/m   Weight change: @WEIGHTCHANGE @ Height:   Height: 5' 4.5" (163.8 cm)  Ht Readings from Last 3 Encounters:  01/11/21 5' 4.5" (1.638 m)  01/07/20 5' 4.5" (1.638 m)  11/12/18 5' 4.37" (1.635 m)    General appearance: alert, cooperative and appears stated age Head: Normocephalic, without obvious abnormality, atraumatic Neck: no adenopathy, supple, symmetrical, trachea midline and thyroid normal to inspection and palpation Lungs: clear to auscultation bilaterally Cardiovascular: regular rate and rhythm Breasts: normal appearance, no masses or tenderness Abdomen: soft, non-tender; non distended,  no masses,  no organomegaly  Extremities: extremities normal, atraumatic, no cyanosis or edema Skin: Skin color, texture, turgor normal. No rashes or lesions Lymph nodes: Cervical, supraclavicular, and axillary nodes normal. No abnormal inguinal nodes palpated Neurologic: Grossly normal   Pelvic: External genitalia:  no lesions              Urethra:  normal appearing urethra with no masses, tenderness or lesions              Bartholins and Skenes: normal                 Vagina: normal appearing vagina with normal color and discharge, no lesions              Cervix: no lesions               Bimanual Exam:  Uterus:  normal size, contour, position, consistency, mobility, non-tender              Adnexa: no mass, fullness, tenderness                Rectovaginal: Confirms               Anus:  normal sphincter tone, no lesions  Carolynn Serve chaperoned for the exam.  1. Well woman exam Discussed breast self exam Discussed calcium and vit D intake No pap this year Mammogram UTD  2. Oligomenorrhea, unspecified type No other symptoms.  - TSH - Prolactin - Follicle stimulating hormone - Estradiol  3. Laboratory exam ordered as part of routine general medical examination - CBC - Comprehensive metabolic panel - Lipid panel

## 2021-01-11 ENCOUNTER — Other Ambulatory Visit: Payer: Self-pay

## 2021-01-11 ENCOUNTER — Ambulatory Visit (INDEPENDENT_AMBULATORY_CARE_PROVIDER_SITE_OTHER): Payer: BC Managed Care – PPO | Admitting: Obstetrics and Gynecology

## 2021-01-11 ENCOUNTER — Encounter: Payer: Self-pay | Admitting: Obstetrics and Gynecology

## 2021-01-11 VITALS — BP 110/60 | HR 72 | Ht 64.5 in | Wt 109.0 lb

## 2021-01-11 DIAGNOSIS — N915 Oligomenorrhea, unspecified: Secondary | ICD-10-CM | POA: Diagnosis not present

## 2021-01-11 DIAGNOSIS — Z01419 Encounter for gynecological examination (general) (routine) without abnormal findings: Secondary | ICD-10-CM

## 2021-01-11 DIAGNOSIS — Z0000 Encounter for general adult medical examination without abnormal findings: Secondary | ICD-10-CM

## 2021-01-11 NOTE — Patient Instructions (Signed)

## 2021-01-12 LAB — COMPREHENSIVE METABOLIC PANEL
AG Ratio: 1.8 (calc) (ref 1.0–2.5)
ALT: 11 U/L (ref 6–29)
AST: 14 U/L (ref 10–30)
Albumin: 4.4 g/dL (ref 3.6–5.1)
Alkaline phosphatase (APISO): 49 U/L (ref 31–125)
BUN/Creatinine Ratio: 11 (calc) (ref 6–22)
BUN: 13 mg/dL (ref 7–25)
CO2: 25 mmol/L (ref 20–32)
Calcium: 9.5 mg/dL (ref 8.6–10.2)
Chloride: 104 mmol/L (ref 98–110)
Creat: 1.16 mg/dL — ABNORMAL HIGH (ref 0.50–0.99)
Globulin: 2.4 g/dL (calc) (ref 1.9–3.7)
Glucose, Bld: 95 mg/dL (ref 65–99)
Potassium: 4.2 mmol/L (ref 3.5–5.3)
Sodium: 139 mmol/L (ref 135–146)
Total Bilirubin: 1 mg/dL (ref 0.2–1.2)
Total Protein: 6.8 g/dL (ref 6.1–8.1)

## 2021-01-12 LAB — CBC
HCT: 38.8 % (ref 35.0–45.0)
Hemoglobin: 12 g/dL (ref 11.7–15.5)
MCH: 26.9 pg — ABNORMAL LOW (ref 27.0–33.0)
MCHC: 30.9 g/dL — ABNORMAL LOW (ref 32.0–36.0)
MCV: 87 fL (ref 80.0–100.0)
MPV: 10.8 fL (ref 7.5–12.5)
Platelets: 283 10*3/uL (ref 140–400)
RBC: 4.46 10*6/uL (ref 3.80–5.10)
RDW: 12.4 % (ref 11.0–15.0)
WBC: 5.5 10*3/uL (ref 3.8–10.8)

## 2021-01-12 LAB — ESTRADIOL: Estradiol: 43 pg/mL

## 2021-01-12 LAB — PROLACTIN: Prolactin: 7.8 ng/mL

## 2021-01-12 LAB — TSH: TSH: 1.34 mIU/L

## 2021-01-12 LAB — FOLLICLE STIMULATING HORMONE: FSH: 3.9 m[IU]/mL

## 2021-01-12 LAB — LIPID PANEL
Cholesterol: 169 mg/dL (ref <200)
HDL: 70 mg/dL (ref >=50)
LDL Cholesterol (Calc): 80 mg/dL (calc)
Non-HDL Cholesterol (Calc): 99 mg/dL (calc) (ref <130)
Total CHOL/HDL Ratio: 2.4 (calc) (ref <5.0)
Triglycerides: 108 mg/dL (ref <150)

## 2021-08-08 DIAGNOSIS — E611 Iron deficiency: Secondary | ICD-10-CM | POA: Diagnosis not present

## 2021-08-08 DIAGNOSIS — R002 Palpitations: Secondary | ICD-10-CM | POA: Diagnosis not present

## 2021-08-08 DIAGNOSIS — Z681 Body mass index (BMI) 19 or less, adult: Secondary | ICD-10-CM | POA: Diagnosis not present

## 2021-08-08 DIAGNOSIS — Z0001 Encounter for general adult medical examination with abnormal findings: Secondary | ICD-10-CM | POA: Diagnosis not present

## 2021-08-08 DIAGNOSIS — E559 Vitamin D deficiency, unspecified: Secondary | ICD-10-CM | POA: Diagnosis not present

## 2021-08-08 DIAGNOSIS — R55 Syncope and collapse: Secondary | ICD-10-CM | POA: Diagnosis not present

## 2021-09-26 ENCOUNTER — Encounter: Payer: BC Managed Care – PPO | Attending: Family Medicine | Admitting: Nutrition

## 2021-09-26 DIAGNOSIS — R634 Abnormal weight loss: Secondary | ICD-10-CM | POA: Insufficient documentation

## 2021-09-26 NOTE — Progress Notes (Signed)
Medical Nutrition Therapy  Appointment Start time:  1530  Appointment End time:  1630  Primary concerns today: Unplanned weight loss , underweight Referral diagnosis: R63.4, R63.6 Preferred learning style: No preference Learning readiness: Ready    NUTRITION ASSESSMENT  46 yr old wfemale concerned about weight loss and possiblelow blood sugars at times.No reported history of reactive hypoglycemia. PMH: Anemia,  Anxiety/ Depression( no meds). She is back up to 115 after being down to 109. UBW a few years ago was 120 lbs.  Fell in bathroom and got faintly about 6 weeks ago mid April 2023. Got nausea, and then had diarrhea..  Has some sweating, dizzines. She had been down to 109 lbs and is now back up to 115 lbs. PCP: Dr. Hal Hope in [place]. Current weight is back up some.  Blood work, low in iron, ? Low blood sugar? Prescribed: Iron and prenalal vitamin and is on spironolactone from her dermatologist.  100 mg of spirlactone.   Anthropometrics  Wt Readings from Last 3 Encounters:  09/26/21 (P) 115 lb (52.2 kg)  01/11/21 109 lb (49.4 kg)  01/07/20 110 lb (49.9 kg)   Ht Readings from Last 3 Encounters:  09/26/21 (P) 5' 4.5" (1.638 m)  01/11/21 5' 4.5" (1.638 m)  01/07/20 5' 4.5" (1.638 m)   Body mass index is 19.43 kg/m (pended). @BMIFA @ Facility age limit for growth %iles is 20 years. Facility age limit for growth %iles is 20 years.    Clinical Medical Hx: SPirlatone for skin  Medications: See list Labs:     Latest Ref Rng & Units 01/11/2021   12:05 PM 11/14/2017    2:30 PM 10/30/2017   11:23 AM  CMP  Glucose 65 - 99 mg/dL 95  83  93   BUN 7 - 25 mg/dL 13  10  12    Creatinine 0.50 - 0.99 mg/dL 12/31/2017   7.34   Sodium 135 - 146 mmol/L 139  142  142   Potassium 3.5 - 5.3 mmol/L 4.2  4.7  4.3   Chloride 98 - 110 mmol/L 104  107  105   CO2 20 - 32 mmol/L 25  24  20    Calcium 8.6 - 10.2 mg/dL 9.5  9.2  9.6   Total Protein 6.1 - 8.1 g/dL 6.8  6.5  6.8   Total Bilirubin  0.2 - 1.2 mg/dL 1.0  1.2  1.2   Alkaline Phos 39 - 117 IU/L  55  53   AST 10 - 30 U/L 14  16  15    ALT 6 - 29 U/L 11  14  12     Lipid Panel     Component Value Date/Time   CHOL 169 01/11/2021 1205   CHOL 143 10/30/2017 1123   TRIG 108 01/11/2021 1205   HDL 70 01/11/2021 1205   HDL 69 10/30/2017 1123   CHOLHDL 2.4 01/11/2021 1205   LDLCALC 80 01/11/2021 1205   LABVLDL 14 10/30/2017 1123    Notable Signs/Symptoms: Weakness and faint feeling at time.  Lifestyle & Dietary Hx Lives with her husband and doesn't work outside the home.  Estimated daily fluid intake: 6-8 glasses  Supplements: iron and prenatal Sleep: 7 hrs Stress / self-care:  Current average weekly physical activity: Workouts a few times per week and walks her dogs  24-Hr Dietary Recall First Meal: Eggs, smoked salmon or part of a bagel and fruit Snack:  Second Meal: Chick fila or has left overs or sandwich, apple, Snack:  Third  Meal: salad - spring mix,and asasin dumplings, water Snack:  Beverages: water,  Estimated Energy Needs Calories: 1800 Carbohydrate: 200g Protein: 135g Fat: 50g   NUTRITION DIAGNOSIS  NB-1.1 Food and nutrition-related knowledge deficit As related to Insuffient nutrient intake .  As evidenced by weight loss and possible low blood sugars sysmptoms..   NUTRITION INTERVENTION  Nutrition education (E-1) on the following topics:  Nutrition and Diabetes education provided on My Plate, CHO counting, meal planning, portion sizes, timing of meals, s, signs/symptoms and treatment of hyper/hypoglycemia,, benefits of exercising 30 minutes per day.  Stressed the need for protein and complex CHO at all meals to prevent reactive hypoglycemia.  Handouts Provided Include  Lifestyle Handouts Plate Method  Learning Style & Readiness for Change Teaching method utilized: Visual & Auditory  Demonstrated degree of understanding via: Teach Back  Barriers to learning/adherence to lifestyle change:  none  Goals Established by Pt Goals Gain 1 per month. Increase carb choices to 3 servings per meal Don't skip meals. Eat snack between meal if needed. Be sure to eat protein with all meals and snacks. Water 64 oz  of water per day. Choose high iron foods.   MONITORING & EVALUATION Dietary intake, weekly physical activity, and symptoms of low blood sugars  in 1 month.  Next Steps  Patient is to work on eating better balanced plant rich foods.Marland Kitchen

## 2021-09-26 NOTE — Patient Instructions (Addendum)
Goals Gain 1 per month. Increase carb choices to 3 servings per meal Don't skip meals. Eat snack between meal if needed. Be sure to eat protein with all meals and snacks. Water 64 oz  of water per day. Choose high iron foods.

## 2021-10-31 DIAGNOSIS — R002 Palpitations: Secondary | ICD-10-CM | POA: Diagnosis not present

## 2021-10-31 DIAGNOSIS — Z681 Body mass index (BMI) 19 or less, adult: Secondary | ICD-10-CM | POA: Diagnosis not present

## 2021-10-31 DIAGNOSIS — D509 Iron deficiency anemia, unspecified: Secondary | ICD-10-CM | POA: Diagnosis not present

## 2021-10-31 DIAGNOSIS — Z0001 Encounter for general adult medical examination with abnormal findings: Secondary | ICD-10-CM | POA: Diagnosis not present

## 2021-10-31 DIAGNOSIS — N926 Irregular menstruation, unspecified: Secondary | ICD-10-CM | POA: Diagnosis not present

## 2021-12-04 DIAGNOSIS — E611 Iron deficiency: Secondary | ICD-10-CM | POA: Diagnosis not present

## 2021-12-04 DIAGNOSIS — E46 Unspecified protein-calorie malnutrition: Secondary | ICD-10-CM | POA: Diagnosis not present

## 2021-12-04 DIAGNOSIS — D509 Iron deficiency anemia, unspecified: Secondary | ICD-10-CM | POA: Diagnosis not present

## 2021-12-04 DIAGNOSIS — Z681 Body mass index (BMI) 19 or less, adult: Secondary | ICD-10-CM | POA: Diagnosis not present

## 2022-01-16 DIAGNOSIS — Z01419 Encounter for gynecological examination (general) (routine) without abnormal findings: Secondary | ICD-10-CM | POA: Diagnosis not present

## 2022-01-16 DIAGNOSIS — Z1231 Encounter for screening mammogram for malignant neoplasm of breast: Secondary | ICD-10-CM | POA: Diagnosis not present

## 2022-01-18 ENCOUNTER — Other Ambulatory Visit: Payer: Self-pay | Admitting: Obstetrics and Gynecology

## 2022-01-18 DIAGNOSIS — R928 Other abnormal and inconclusive findings on diagnostic imaging of breast: Secondary | ICD-10-CM

## 2022-02-16 ENCOUNTER — Ambulatory Visit: Admission: RE | Admit: 2022-02-16 | Payer: BC Managed Care – PPO | Source: Ambulatory Visit

## 2022-02-16 ENCOUNTER — Ambulatory Visit
Admission: RE | Admit: 2022-02-16 | Discharge: 2022-02-16 | Disposition: A | Discharge status: Home / Self Care | Payer: BC Managed Care – PPO | Source: Ambulatory Visit | Attending: Obstetrics and Gynecology | Admitting: Obstetrics and Gynecology

## 2022-02-16 DIAGNOSIS — R92332 Mammographic heterogeneous density, left breast: Secondary | ICD-10-CM | POA: Diagnosis not present

## 2022-02-16 DIAGNOSIS — R928 Other abnormal and inconclusive findings on diagnostic imaging of breast: Secondary | ICD-10-CM

## 2022-03-05 ENCOUNTER — Encounter: Payer: Self-pay | Admitting: Nutrition

## 2022-03-13 DIAGNOSIS — L700 Acne vulgaris: Secondary | ICD-10-CM | POA: Diagnosis not present

## 2022-03-13 DIAGNOSIS — Z79899 Other long term (current) drug therapy: Secondary | ICD-10-CM | POA: Diagnosis not present

## 2022-03-13 DIAGNOSIS — L719 Rosacea, unspecified: Secondary | ICD-10-CM | POA: Diagnosis not present

## 2022-04-28 IMAGING — MG DIGITAL SCREENING BILAT W/ CAD
4 series · 4 of 4 positions shown · non-contrast
Comparison: Previous exam(s).

CLINICAL DATA: Screening.

EXAM:
DIGITAL SCREENING BILATERAL MAMMOGRAM WITH CAD

[L MLO]
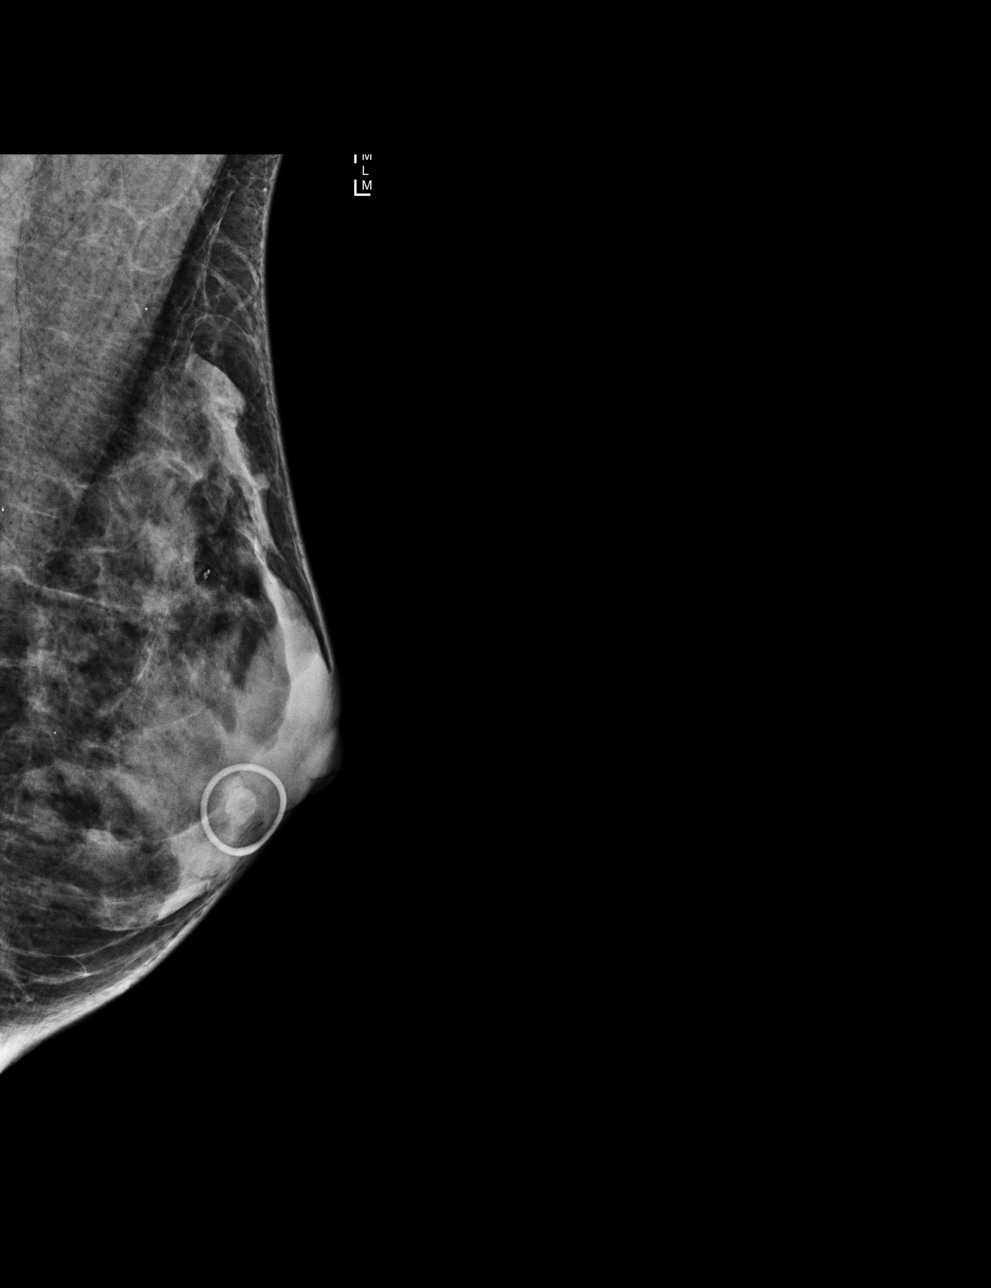

[R MLO]
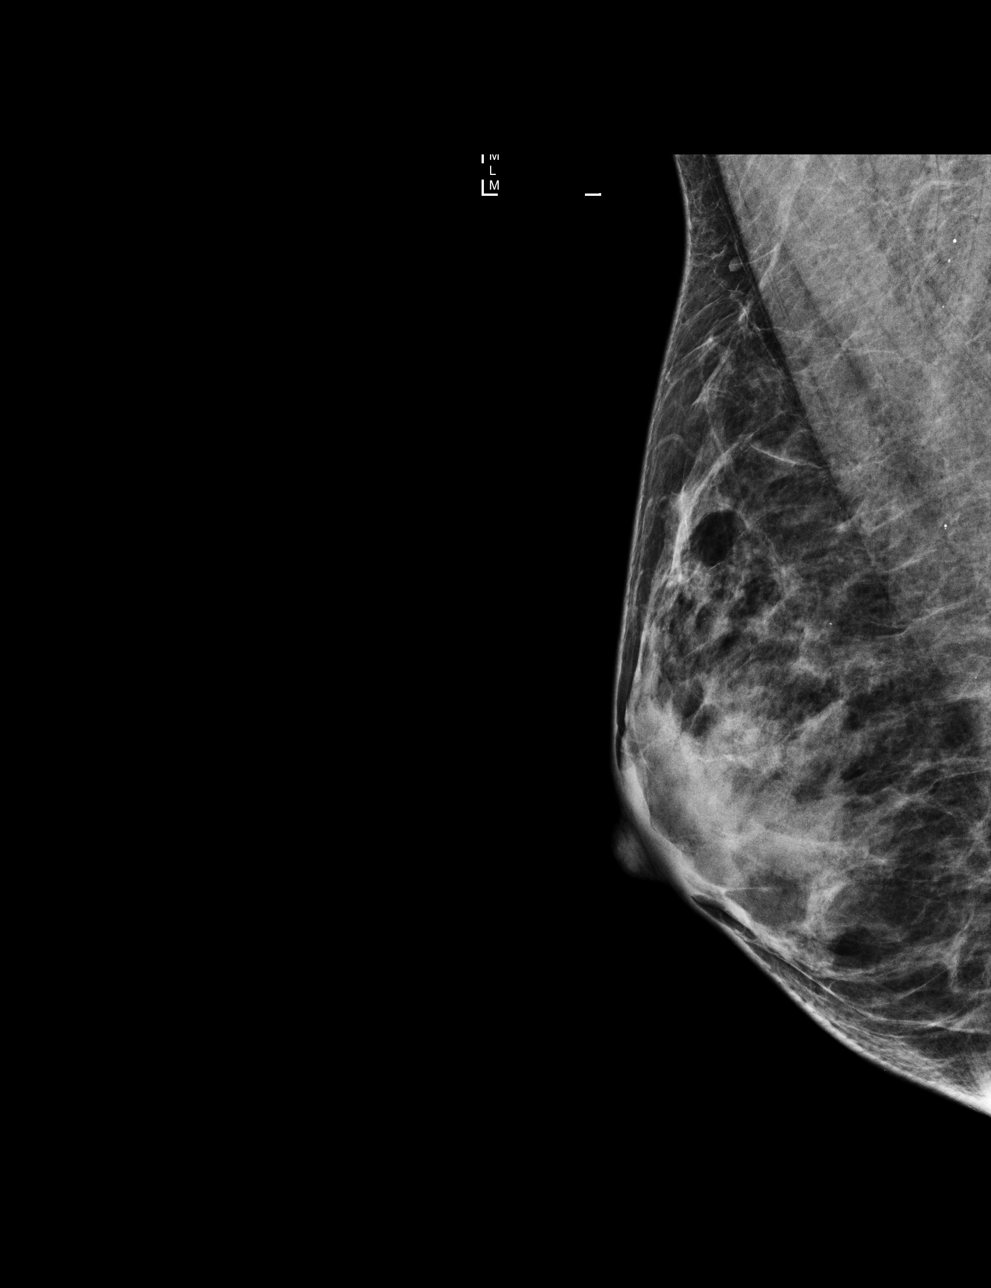

[R CC]
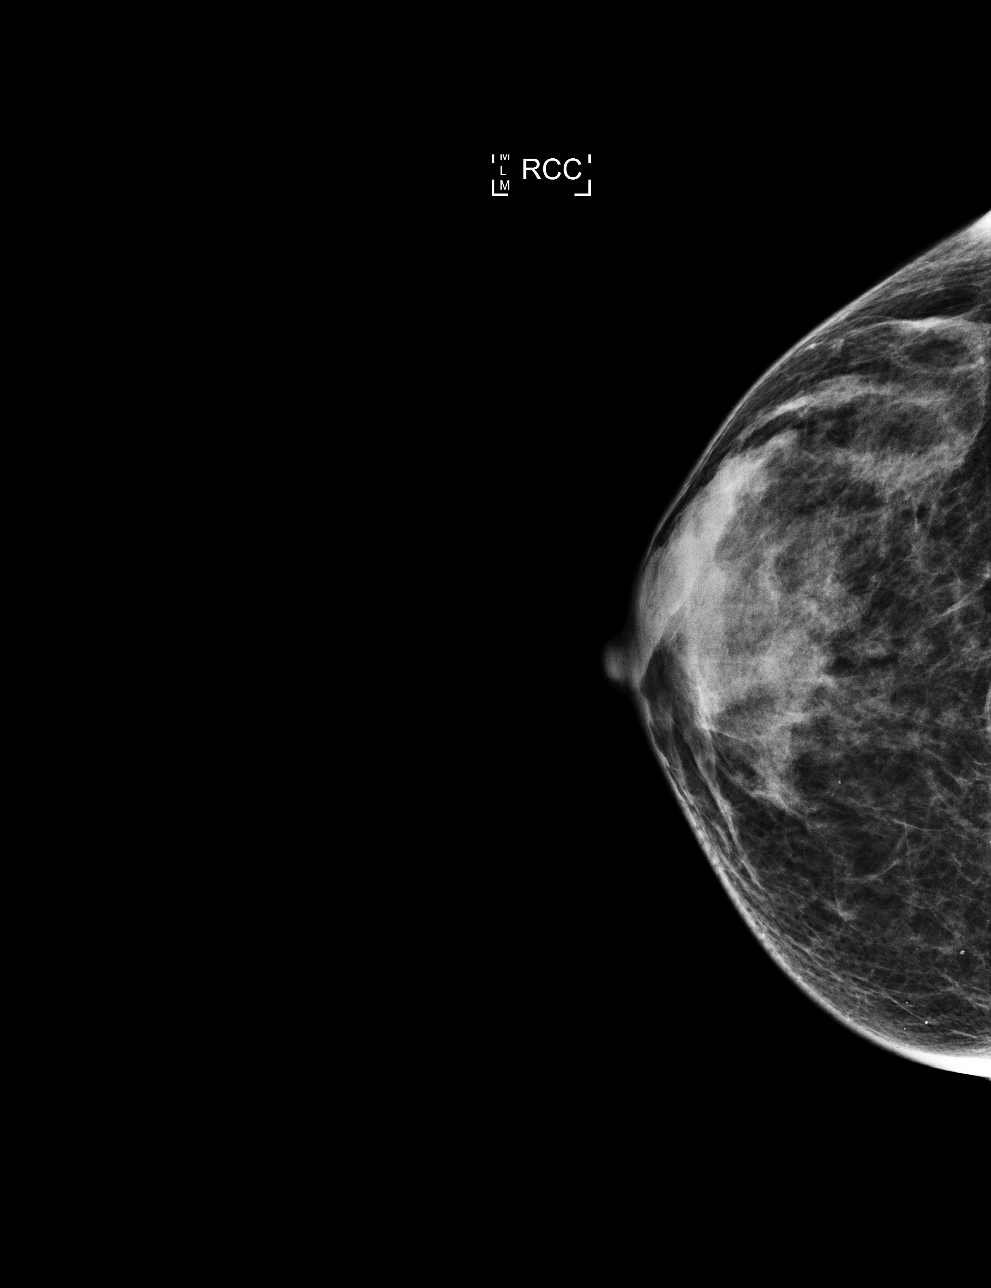

[L CC]
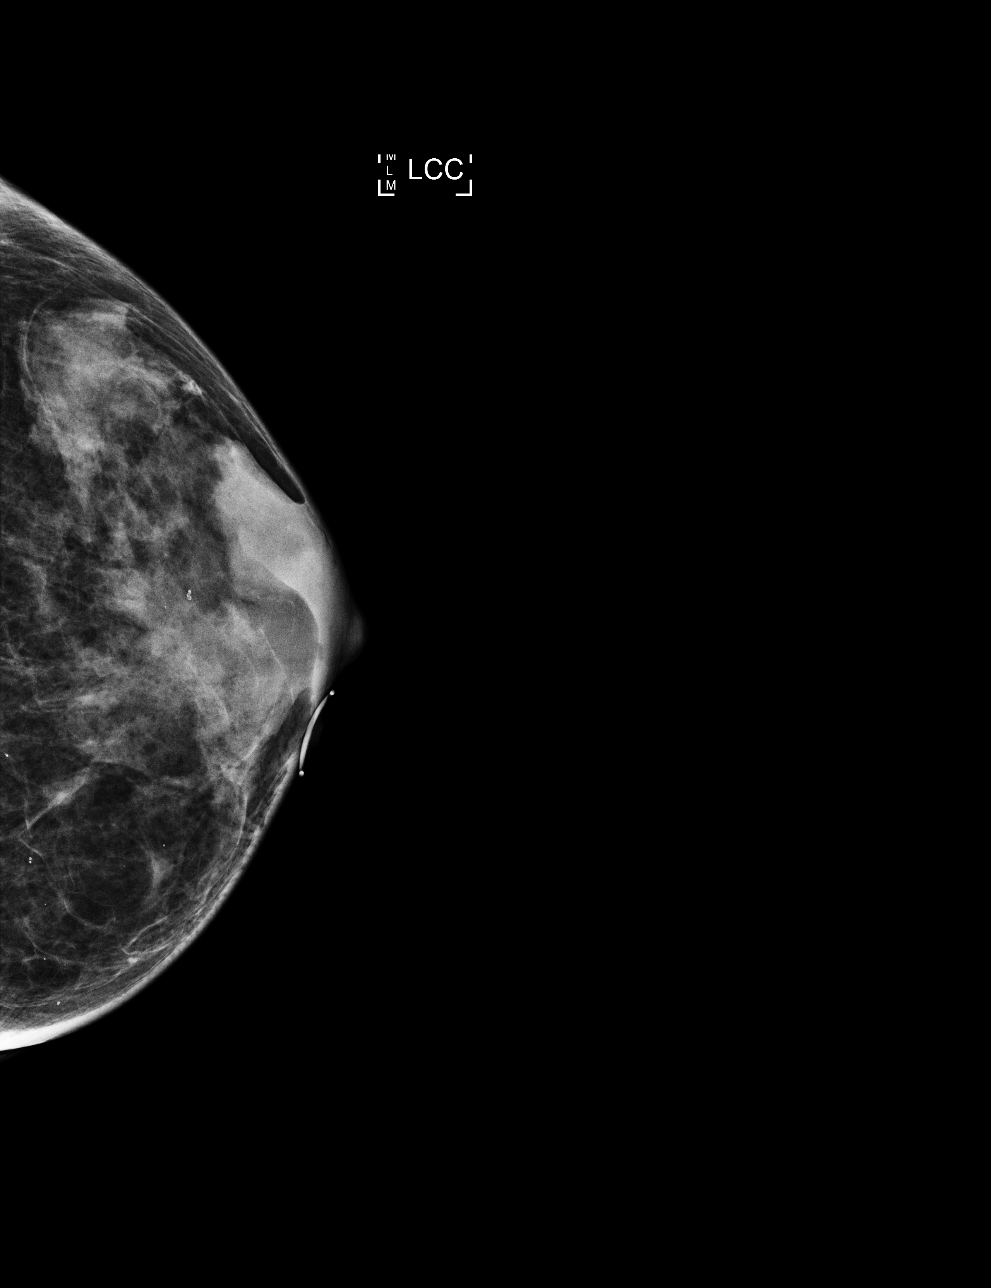

[4 of 4 positions shown; findings below may reference images not displayed]

ACR Breast Density Category c: The breast tissue is heterogeneously
dense, which may obscure small masses.
FINDINGS: There are no findings suspicious for malignancy. Images were
processed with CAD.
IMPRESSION: No mammographic evidence of malignancy. A result letter of this
screening mammogram will be mailed directly to the patient.

RECOMMENDATION:
Screening mammogram in one year. (Code:[id number])

BI-RADS CATEGORY  1: Negative.

## 2022-05-30 ENCOUNTER — Encounter: Payer: Self-pay | Admitting: Nutrition

## 2022-10-08 DIAGNOSIS — R0600 Dyspnea, unspecified: Secondary | ICD-10-CM | POA: Diagnosis not present

## 2022-10-08 DIAGNOSIS — M549 Dorsalgia, unspecified: Secondary | ICD-10-CM | POA: Diagnosis not present

## 2022-10-08 DIAGNOSIS — R079 Chest pain, unspecified: Secondary | ICD-10-CM | POA: Diagnosis not present

## 2022-10-08 DIAGNOSIS — M546 Pain in thoracic spine: Secondary | ICD-10-CM | POA: Diagnosis not present

## 2022-10-08 DIAGNOSIS — R9431 Abnormal electrocardiogram [ECG] [EKG]: Secondary | ICD-10-CM | POA: Diagnosis not present

## 2022-10-08 DIAGNOSIS — K209 Esophagitis, unspecified without bleeding: Secondary | ICD-10-CM | POA: Diagnosis not present

## 2022-11-02 DIAGNOSIS — M722 Plantar fascial fibromatosis: Secondary | ICD-10-CM | POA: Diagnosis not present

## 2022-11-02 DIAGNOSIS — M79671 Pain in right foot: Secondary | ICD-10-CM | POA: Diagnosis not present

## 2022-11-19 DIAGNOSIS — M722 Plantar fascial fibromatosis: Secondary | ICD-10-CM | POA: Diagnosis not present

## 2022-11-29 DIAGNOSIS — M722 Plantar fascial fibromatosis: Secondary | ICD-10-CM | POA: Diagnosis not present

## 2022-11-29 DIAGNOSIS — M79671 Pain in right foot: Secondary | ICD-10-CM | POA: Diagnosis not present

## 2022-12-05 DIAGNOSIS — M79671 Pain in right foot: Secondary | ICD-10-CM | POA: Diagnosis not present

## 2022-12-05 DIAGNOSIS — M722 Plantar fascial fibromatosis: Secondary | ICD-10-CM | POA: Diagnosis not present

## 2022-12-10 DIAGNOSIS — M79671 Pain in right foot: Secondary | ICD-10-CM | POA: Diagnosis not present

## 2022-12-10 DIAGNOSIS — M722 Plantar fascial fibromatosis: Secondary | ICD-10-CM | POA: Diagnosis not present

## 2023-01-29 DIAGNOSIS — Z1231 Encounter for screening mammogram for malignant neoplasm of breast: Secondary | ICD-10-CM | POA: Diagnosis not present

## 2023-01-29 DIAGNOSIS — Z01419 Encounter for gynecological examination (general) (routine) without abnormal findings: Secondary | ICD-10-CM | POA: Diagnosis not present

## 2023-02-13 ENCOUNTER — Encounter: Payer: Self-pay | Admitting: *Deleted

## 2023-02-26 DIAGNOSIS — E46 Unspecified protein-calorie malnutrition: Secondary | ICD-10-CM | POA: Diagnosis not present

## 2023-02-26 DIAGNOSIS — E162 Hypoglycemia, unspecified: Secondary | ICD-10-CM | POA: Diagnosis not present

## 2023-02-26 DIAGNOSIS — R002 Palpitations: Secondary | ICD-10-CM | POA: Diagnosis not present

## 2023-02-26 DIAGNOSIS — Z681 Body mass index (BMI) 19 or less, adult: Secondary | ICD-10-CM | POA: Diagnosis not present

## 2023-02-26 DIAGNOSIS — R7301 Impaired fasting glucose: Secondary | ICD-10-CM | POA: Diagnosis not present

## 2023-02-26 DIAGNOSIS — D509 Iron deficiency anemia, unspecified: Secondary | ICD-10-CM | POA: Diagnosis not present

## 2023-02-26 DIAGNOSIS — Z0001 Encounter for general adult medical examination with abnormal findings: Secondary | ICD-10-CM | POA: Diagnosis not present

## 2023-03-13 DIAGNOSIS — L700 Acne vulgaris: Secondary | ICD-10-CM | POA: Diagnosis not present

## 2023-03-13 DIAGNOSIS — Z79899 Other long term (current) drug therapy: Secondary | ICD-10-CM | POA: Diagnosis not present

## 2023-03-13 DIAGNOSIS — L719 Rosacea, unspecified: Secondary | ICD-10-CM | POA: Diagnosis not present

## 2023-03-25 ENCOUNTER — Ambulatory Visit: Payer: BC Managed Care – PPO | Admitting: Gastroenterology

## 2023-04-04 ENCOUNTER — Encounter: Payer: Self-pay | Admitting: Internal Medicine

## 2023-04-04 ENCOUNTER — Other Ambulatory Visit: Payer: Self-pay | Admitting: *Deleted

## 2023-04-04 ENCOUNTER — Encounter: Payer: Self-pay | Admitting: *Deleted

## 2023-04-04 ENCOUNTER — Ambulatory Visit: Payer: BC Managed Care – PPO | Admitting: Internal Medicine

## 2023-04-04 VITALS — BP 110/76 | HR 94 | Temp 98.4°F | Ht 64.5 in | Wt 116.2 lb

## 2023-04-04 DIAGNOSIS — R1310 Dysphagia, unspecified: Secondary | ICD-10-CM | POA: Diagnosis not present

## 2023-04-04 DIAGNOSIS — R1319 Other dysphagia: Secondary | ICD-10-CM

## 2023-04-04 DIAGNOSIS — Z1211 Encounter for screening for malignant neoplasm of colon: Secondary | ICD-10-CM

## 2023-04-04 DIAGNOSIS — R194 Change in bowel habit: Secondary | ICD-10-CM

## 2023-04-04 DIAGNOSIS — K219 Gastro-esophageal reflux disease without esophagitis: Secondary | ICD-10-CM | POA: Diagnosis not present

## 2023-04-04 DIAGNOSIS — K649 Unspecified hemorrhoids: Secondary | ICD-10-CM | POA: Diagnosis not present

## 2023-04-04 NOTE — Patient Instructions (Signed)
We will schedule you for upper endoscopy to further evaluate your reflux and indigestion.  At the same time we will perform colonoscopy for colon cancer screening purposes.  I will evaluate your hemorrhoids and change in bowel habits as well.  Continue on Pepcid daily for now.  We may make adjustments pending endoscopic findings.  It was very nice meeting you today.  Dr. Marletta Lor

## 2023-04-04 NOTE — Progress Notes (Signed)
Primary Care Physician:  Dois Davenport, MD Primary Gastroenterologist:  Dr. Marletta Lor  Chief Complaint  Patient presents with   New Patient (Initial Visit)    Pt here for ov before colonoscopy, change in bowel habits----diarrhea. Also acid reflux    HPI:   Amanda Stanley is a 47 y.o. female who presents to the clinic today by referral from her PCP Dr. Hal Hope for evaluation.  Multiple GI complaints for me today.  GERD, dysphagia, indigestion-symptoms have been going on for years now.  Notes issues with heartburn and indigestion.  At 1 point was on omeprazole for a few weeks years ago.  Now currently taking Pepcid once a day.  Previously on twice a day.  States her dysphagia has actually improved on this medication though it seems like it has made her indigestion worse.  No previous upper endoscopy.  Colon cancer screening-no prior colonoscopy.  Reports family history of colon cancer in 1 grandmother.  Change in bowels-notes ever since having cholecystectomy in 2017 she has had issues with bowels.  Seems to be chronically on the looser side will occasionally have formed stools.  No melena hematochezia.  Hemorrhoids: Notes these flareup on occasion.  Causes irritation, occasional rectal pain.  Past Medical History:  Diagnosis Date   Anxiety 2005   no meds in 10 years   Depression 2005   no meds in 10 years   Rosacea     Past Surgical History:  Procedure Laterality Date   CHOLECYSTECTOMY  09/2015    WISDOM TOOTH EXTRACTION     college    Current Outpatient Medications  Medication Sig Dispense Refill   famotidine (PEPCID) 20 MG tablet Take 20 mg by mouth daily. This is over the counter     spironolactone (ALDACTONE) 100 MG tablet Take 100 mg by mouth daily.     spironolactone (ALDACTONE) 50 MG tablet Take 50 mg by mouth daily.     No current facility-administered medications for this visit.    Allergies as of 04/04/2023 - Review Complete 04/04/2023  Allergen Reaction  Noted   Doxycycline Hives 10/06/2012    Family History  Problem Relation Age of Onset   Hypertension Mother    Thyroid disease Mother    Hyperlipidemia Mother    Heart attack Father    Osteoporosis Maternal Grandmother    Stroke Paternal Grandmother    Stroke Paternal Grandfather     Social History   Socioeconomic History   Marital status: Married    Spouse name: Not on file   Number of children: Not on file   Years of education: Not on file   Highest education level: Not on file  Occupational History   Not on file  Tobacco Use   Smoking status: Never   Smokeless tobacco: Never  Vaping Use   Vaping status: Never Used  Substance and Sexual Activity   Alcohol use: Yes    Alcohol/week: 4.0 - 5.0 standard drinks of alcohol    Types: 4 - 5 Standard drinks or equivalent per week   Drug use: No   Sexual activity: Yes    Partners: Male    Birth control/protection: Pill  Other Topics Concern   Not on file  Social History Narrative   Not on file   Social Drivers of Health   Financial Resource Strain: Not on file  Food Insecurity: Not on file  Transportation Needs: Not on file  Physical Activity: Not on file  Stress: Not on file  Social Connections: Not on file  Intimate Partner Violence: Not on file    Subjective: Review of Systems  Constitutional:  Negative for chills and fever.  HENT:  Negative for congestion and hearing loss.   Eyes:  Negative for blurred vision and double vision.  Respiratory:  Negative for cough and shortness of breath.   Cardiovascular:  Negative for chest pain and palpitations.  Gastrointestinal:  Negative for abdominal pain, blood in stool, constipation, diarrhea, heartburn, melena and vomiting.  Genitourinary:  Negative for dysuria and urgency.  Musculoskeletal:  Negative for joint pain and myalgias.  Skin:  Negative for itching and rash.  Neurological:  Negative for dizziness and headaches.  Psychiatric/Behavioral:  Negative for  depression. The patient is not nervous/anxious.        Objective: BP 110/76   Pulse 94   Temp 98.4 F (36.9 C)   Ht 5' 4.5" (1.638 m)   Wt 116 lb 3.2 oz (52.7 kg)   LMP 04/04/2023   BMI 19.64 kg/m  Physical Exam Constitutional:      Appearance: Normal appearance.  HENT:     Head: Normocephalic and atraumatic.  Eyes:     Extraocular Movements: Extraocular movements intact.     Conjunctiva/sclera: Conjunctivae normal.  Cardiovascular:     Rate and Rhythm: Normal rate and regular rhythm.  Pulmonary:     Effort: Pulmonary effort is normal.     Breath sounds: Normal breath sounds.  Abdominal:     General: Bowel sounds are normal.     Palpations: Abdomen is soft.  Musculoskeletal:        General: No swelling. Normal range of motion.     Cervical back: Normal range of motion and neck supple.  Skin:    General: Skin is warm and dry.     Coloration: Skin is not jaundiced.  Neurological:     General: No focal deficit present.     Mental Status: She is alert and oriented to person, place, and time.  Psychiatric:        Mood and Affect: Mood normal.        Behavior: Behavior normal.      Assessment/Plan:  1.  GERD, dysphagia, indigestion- Will schedule for EGD with possible dilation to evaluate for peptic ulcer disease, esophagitis, gastritis, H. Pylori, duodenitis, or other. Will also evaluate for esophageal stricture, Schatzki's ring, esophageal web or other.   The risks including infection, bleed, or perforation as well as benefits, limitations, alternatives and imponderables have been reviewed with the patient. Potential for esophageal dilation, biopsy, etc. have also been reviewed.  Questions have been answered. All parties agreeable.  Continue on Pepcid daily for now, may make adjustments pending endoscopic findings.  2.  Colon cancer screening-at the same time as upper endoscopy, we will perform colonoscopy for colon cancer screening purposes.  3.  Change in  bowels-notes ever since having cholecystectomy in 2017 she has had issues with bowels.  Seems to be chronically on the looser side will occasionally have formed stools.  No melena hematochezia.  Possible IBS, possible bile acid induced diarrhea, discuss further on follow-up visit after colonoscopy.  4.  Hemorrhoids-rectal exam deferred today by patient until time of colonoscopy.  Thank you Dr. Hal Hope for the kind referral 04/04/2023 3:53 PM   Disclaimer: This note was dictated with voice recognition software. Similar sounding words can inadvertently be transcribed and may not be corrected upon review.

## 2023-04-11 DIAGNOSIS — R300 Dysuria: Secondary | ICD-10-CM | POA: Diagnosis not present

## 2023-04-11 DIAGNOSIS — Z881 Allergy status to other antibiotic agents status: Secondary | ICD-10-CM | POA: Diagnosis not present

## 2023-04-11 DIAGNOSIS — R112 Nausea with vomiting, unspecified: Secondary | ICD-10-CM | POA: Diagnosis not present

## 2023-04-11 DIAGNOSIS — N390 Urinary tract infection, site not specified: Secondary | ICD-10-CM | POA: Diagnosis not present

## 2023-04-11 DIAGNOSIS — R1030 Lower abdominal pain, unspecified: Secondary | ICD-10-CM | POA: Diagnosis not present

## 2023-04-11 DIAGNOSIS — R109 Unspecified abdominal pain: Secondary | ICD-10-CM | POA: Diagnosis not present

## 2023-04-15 ENCOUNTER — Telehealth: Payer: Self-pay | Admitting: Internal Medicine

## 2023-04-15 ENCOUNTER — Other Ambulatory Visit: Payer: Self-pay

## 2023-04-15 DIAGNOSIS — R197 Diarrhea, unspecified: Secondary | ICD-10-CM

## 2023-04-15 NOTE — Telephone Encounter (Signed)
Office visit note from 12/12 only mentioned loose stool with possible IBS. Pt is scheduled for procedures on 05/14/23. What do you suggest?

## 2023-04-15 NOTE — Telephone Encounter (Signed)
Patient was last seen on 04/04/2023 by Dr Marletta Lor and is scheduled for her procedure. She left a message that she was having severe diarrhea and wanted to come back in ASAP per her recent ER visit. Please advise if she needs to come in or can we make her some recommendations. [phone]

## 2023-04-15 NOTE — Telephone Encounter (Signed)
Lab was ordered pt was made aware. Will call once she drops stool off to make appt.

## 2023-04-15 NOTE — Telephone Encounter (Signed)
Please order gi profile at [facility] to rule out infectious causes if she has not had stool studies (I cannot see ER visit note)> Please schedule her OV with me or an APP next week thank you

## 2023-04-18 DIAGNOSIS — R197 Diarrhea, unspecified: Secondary | ICD-10-CM | POA: Diagnosis not present

## 2023-04-19 LAB — GI PROFILE, STOOL, PCR

## 2023-05-08 ENCOUNTER — Encounter: Payer: Self-pay | Admitting: Internal Medicine

## 2023-05-08 ENCOUNTER — Ambulatory Visit: Payer: BC Managed Care – PPO | Admitting: Internal Medicine

## 2023-05-08 VITALS — BP 115/73 | HR 99 | Temp 99.2°F | Ht 64.5 in | Wt 118.4 lb

## 2023-05-08 DIAGNOSIS — R197 Diarrhea, unspecified: Secondary | ICD-10-CM

## 2023-05-08 DIAGNOSIS — K30 Functional dyspepsia: Secondary | ICD-10-CM

## 2023-05-08 DIAGNOSIS — R1310 Dysphagia, unspecified: Secondary | ICD-10-CM | POA: Diagnosis not present

## 2023-05-08 DIAGNOSIS — K219 Gastro-esophageal reflux disease without esophagitis: Secondary | ICD-10-CM | POA: Diagnosis not present

## 2023-05-08 DIAGNOSIS — R194 Change in bowel habit: Secondary | ICD-10-CM | POA: Diagnosis not present

## 2023-05-08 DIAGNOSIS — Z1211 Encounter for screening for malignant neoplasm of colon: Secondary | ICD-10-CM

## 2023-05-08 DIAGNOSIS — R1319 Other dysphagia: Secondary | ICD-10-CM

## 2023-05-08 DIAGNOSIS — K649 Unspecified hemorrhoids: Secondary | ICD-10-CM

## 2023-05-08 NOTE — Patient Instructions (Signed)
 I am going to request your CT scan from [facility].  We will further workup for your GI symptoms next week with upper endoscopy and colonoscopy.  If there is a chance that your procedures will get postponed due to weather we will do our best to call you with ample time though this is unlikely to happen.  Continue on omeprazole.  It was very nice seeing you again today.  Dr. Mordechai April

## 2023-05-08 NOTE — H&P (View-Only) (Signed)
Primary Care Physician:  Dois Davenport, MD Primary Gastroenterologist:  Dr. Marletta Lor  Chief Complaint  Patient presents with   Abdominal Pain    Patient here today due to having a recent bout of abdominal pain and diarrhea, which prompted her to go to the Ed in [place], [place] around 04/11/2023. She says they diagnosed her with a UTI, and which she was treated for. She says they did a ct scan and blood work, which she says they told her the ct and blood work showed nothing. She says the [address] location wanted her to have a stool study was negative. She says the symptoms have gotten better, was told by the Er that it was probably a gi bug.    HPI:   Amanda Stanley is a 48 y.o. female who presents to the clinic for follow up visit.  GERD, dysphagia, indigestion-symptoms have been going on for years now.  Notes issues with heartburn and indigestion.  At 1 point was on omeprazole for a few weeks years ago.  Now currently taking Pepcid once a day.  Previously on twice a day.  States her dysphagia has actually improved on this medication though it seems like it has made her indigestion worse.  No previous upper endoscopy.  She is scheduled for upper endoscopy next week to further evaluate.  Colon cancer screening-no prior colonoscopy.  Reports family history of colon cancer in 1 grandmother.  She is scheduled for colonoscopy next week to further evaluate.  Change in bowels-notes ever since having cholecystectomy in 2017 she has had issues with bowels.  Seems to be chronically on the looser side will occasionally have formed stools.  No melena hematochezia.  Recent flare in her symptoms prompting ER visit in [place].  She states she had a CT scan which was negative.  Stool studies negative for GI pathogens.  Hemorrhoids: Notes these flareup on occasion.  Causes irritation, occasional rectal pain.  Past Medical History:  Diagnosis Date   Anxiety 2005   no meds in 10 years   Depression  2005   no meds in 10 years   Rosacea     Past Surgical History:  Procedure Laterality Date   CHOLECYSTECTOMY  09/2015    WISDOM TOOTH EXTRACTION     college    Current Outpatient Medications  Medication Sig Dispense Refill   omeprazole (PRILOSEC OTC) 20 MG tablet Take 20 mg by mouth daily.     spironolactone (ALDACTONE) 100 MG tablet Take 100 mg by mouth daily.     VIENVA 0.1-20 MG-MCG tablet Take 1 tablet by mouth daily.     famotidine (PEPCID) 20 MG tablet Take 20 mg by mouth daily. This is over the counter (Patient not taking: Reported on 05/08/2023)     No current facility-administered medications for this visit.    Allergies as of 05/08/2023 - Review Complete 05/08/2023  Allergen Reaction Noted   Doxycycline Hives 10/06/2012    Family History  Problem Relation Age of Onset   Hypertension Mother    Thyroid disease Mother    Hyperlipidemia Mother    Heart attack Father    Osteoporosis Maternal Grandmother    Stroke Paternal Grandmother    Stroke Paternal Grandfather     Social History   Socioeconomic History   Marital status: Married    Spouse name: Not on file   Number of children: Not on file   Years of education: Not on file   Highest education level: Not  on file  Occupational History   Not on file  Tobacco Use   Smoking status: Never   Smokeless tobacco: Never  Vaping Use   Vaping status: Never Used  Substance and Sexual Activity   Alcohol use: Yes    Alcohol/week: 4.0 - 5.0 standard drinks of alcohol    Types: 4 - 5 Standard drinks or equivalent per week   Drug use: No   Sexual activity: Yes    Partners: Male    Birth control/protection: Pill  Other Topics Concern   Not on file  Social History Narrative   Not on file   Social Drivers of Health   Financial Resource Strain: Not on file  Food Insecurity: Not on file  Transportation Needs: Not on file  Physical Activity: Not on file  Stress: Not on file  Social Connections: Not on file   Intimate Partner Violence: Not on file    Subjective: Review of Systems  Constitutional:  Negative for chills and fever.  HENT:  Negative for congestion and hearing loss.   Eyes:  Negative for blurred vision and double vision.  Respiratory:  Negative for cough and shortness of breath.   Cardiovascular:  Negative for chest pain and palpitations.  Gastrointestinal:  Negative for abdominal pain, blood in stool, constipation, diarrhea, heartburn, melena and vomiting.  Genitourinary:  Negative for dysuria and urgency.  Musculoskeletal:  Negative for joint pain and myalgias.  Skin:  Negative for itching and rash.  Neurological:  Negative for dizziness and headaches.  Psychiatric/Behavioral:  Negative for depression. The patient is not nervous/anxious.        Objective: BP 115/73 (BP Location: Left Arm, Patient Position: Sitting, Cuff Size: Normal)   Pulse 99   Temp 99.2 F (37.3 C) (Oral)   Ht 5' 4.5" (1.638 m)   Wt 118 lb 6.4 oz (53.7 kg)   BMI 20.01 kg/m  Physical Exam Constitutional:      Appearance: Normal appearance.  HENT:     Head: Normocephalic and atraumatic.  Eyes:     Extraocular Movements: Extraocular movements intact.     Conjunctiva/sclera: Conjunctivae normal.  Cardiovascular:     Rate and Rhythm: Normal rate and regular rhythm.  Pulmonary:     Effort: Pulmonary effort is normal.     Breath sounds: Normal breath sounds.  Abdominal:     General: Bowel sounds are normal.     Palpations: Abdomen is soft.  Musculoskeletal:        General: No swelling. Normal range of motion.     Cervical back: Normal range of motion and neck supple.  Skin:    General: Skin is warm and dry.     Coloration: Skin is not jaundiced.  Neurological:     General: No focal deficit present.     Mental Status: She is alert and oriented to person, place, and time.  Psychiatric:        Mood and Affect: Mood normal.        Behavior: Behavior normal.      Assessment/Plan:  1.   GERD, dysphagia, indigestion-EGD scheduled for next week with possible dilation to further evaluate.  Continue on omeprazole daily for now, may make adjustments pending endoscopic findings.  2.  Colon cancer screening-at the same time as upper endoscopy, we will perform colonoscopy for colon cancer screening purposes.  3.  Change in bowels-notes ever since having cholecystectomy in 2017 she has had issues with bowels.  Seems to be chronically on the looser  side will occasionally have formed stools.  No melena hematochezia.  Possible IBS, possible bile acid induced diarrhea, discuss further on follow-up visit after colonoscopy.  Consider trial of cholestyramine  4.  Hemorrhoids-rectal exam deferred today by patient until time of colonoscopy.   05/08/2023 3:53 PM   Disclaimer: This note was dictated with voice recognition software. Similar sounding words can inadvertently be transcribed and may not be corrected upon review.

## 2023-05-08 NOTE — Progress Notes (Signed)
 Primary Care Physician:  Dois Davenport, MD Primary Gastroenterologist:  Dr. Marletta Lor  Chief Complaint  Patient presents with   Abdominal Pain    Patient here today due to having a recent bout of abdominal pain and diarrhea, which prompted her to go to the Ed in [place], [place] around 04/11/2023. She says they diagnosed her with a UTI, and which she was treated for. She says they did a ct scan and blood work, which she says they told her the ct and blood work showed nothing. She says the [address] location wanted her to have a stool study was negative. She says the symptoms have gotten better, was told by the Er that it was probably a gi bug.    HPI:   Amanda Stanley is a 48 y.o. female who presents to the clinic for follow up visit.  GERD, dysphagia, indigestion-symptoms have been going on for years now.  Notes issues with heartburn and indigestion.  At 1 point was on omeprazole for a few weeks years ago.  Now currently taking Pepcid once a day.  Previously on twice a day.  States her dysphagia has actually improved on this medication though it seems like it has made her indigestion worse.  No previous upper endoscopy.  She is scheduled for upper endoscopy next week to further evaluate.  Colon cancer screening-no prior colonoscopy.  Reports family history of colon cancer in 1 grandmother.  She is scheduled for colonoscopy next week to further evaluate.  Change in bowels-notes ever since having cholecystectomy in 2017 she has had issues with bowels.  Seems to be chronically on the looser side will occasionally have formed stools.  No melena hematochezia.  Recent flare in her symptoms prompting ER visit in [place].  She states she had a CT scan which was negative.  Stool studies negative for GI pathogens.  Hemorrhoids: Notes these flareup on occasion.  Causes irritation, occasional rectal pain.  Past Medical History:  Diagnosis Date   Anxiety 2005   no meds in 10 years   Depression  2005   no meds in 10 years   Rosacea     Past Surgical History:  Procedure Laterality Date   CHOLECYSTECTOMY  09/2015    WISDOM TOOTH EXTRACTION     college    Current Outpatient Medications  Medication Sig Dispense Refill   omeprazole (PRILOSEC OTC) 20 MG tablet Take 20 mg by mouth daily.     spironolactone (ALDACTONE) 100 MG tablet Take 100 mg by mouth daily.     VIENVA 0.1-20 MG-MCG tablet Take 1 tablet by mouth daily.     famotidine (PEPCID) 20 MG tablet Take 20 mg by mouth daily. This is over the counter (Patient not taking: Reported on 05/08/2023)     No current facility-administered medications for this visit.    Allergies as of 05/08/2023 - Review Complete 05/08/2023  Allergen Reaction Noted   Doxycycline Hives 10/06/2012    Family History  Problem Relation Age of Onset   Hypertension Mother    Thyroid disease Mother    Hyperlipidemia Mother    Heart attack Father    Osteoporosis Maternal Grandmother    Stroke Paternal Grandmother    Stroke Paternal Grandfather     Social History   Socioeconomic History   Marital status: Married    Spouse name: Not on file   Number of children: Not on file   Years of education: Not on file   Highest education level: Not  on file  Occupational History   Not on file  Tobacco Use   Smoking status: Never   Smokeless tobacco: Never  Vaping Use   Vaping status: Never Used  Substance and Sexual Activity   Alcohol use: Yes    Alcohol/week: 4.0 - 5.0 standard drinks of alcohol    Types: 4 - 5 Standard drinks or equivalent per week   Drug use: No   Sexual activity: Yes    Partners: Male    Birth control/protection: Pill  Other Topics Concern   Not on file  Social History Narrative   Not on file   Social Drivers of Health   Financial Resource Strain: Not on file  Food Insecurity: Not on file  Transportation Needs: Not on file  Physical Activity: Not on file  Stress: Not on file  Social Connections: Not on file   Intimate Partner Violence: Not on file    Subjective: Review of Systems  Constitutional:  Negative for chills and fever.  HENT:  Negative for congestion and hearing loss.   Eyes:  Negative for blurred vision and double vision.  Respiratory:  Negative for cough and shortness of breath.   Cardiovascular:  Negative for chest pain and palpitations.  Gastrointestinal:  Negative for abdominal pain, blood in stool, constipation, diarrhea, heartburn, melena and vomiting.  Genitourinary:  Negative for dysuria and urgency.  Musculoskeletal:  Negative for joint pain and myalgias.  Skin:  Negative for itching and rash.  Neurological:  Negative for dizziness and headaches.  Psychiatric/Behavioral:  Negative for depression. The patient is not nervous/anxious.        Objective: BP 115/73 (BP Location: Left Arm, Patient Position: Sitting, Cuff Size: Normal)   Pulse 99   Temp 99.2 F (37.3 C) (Oral)   Ht 5' 4.5" (1.638 m)   Wt 118 lb 6.4 oz (53.7 kg)   BMI 20.01 kg/m  Physical Exam Constitutional:      Appearance: Normal appearance.  HENT:     Head: Normocephalic and atraumatic.  Eyes:     Extraocular Movements: Extraocular movements intact.     Conjunctiva/sclera: Conjunctivae normal.  Cardiovascular:     Rate and Rhythm: Normal rate and regular rhythm.  Pulmonary:     Effort: Pulmonary effort is normal.     Breath sounds: Normal breath sounds.  Abdominal:     General: Bowel sounds are normal.     Palpations: Abdomen is soft.  Musculoskeletal:        General: No swelling. Normal range of motion.     Cervical back: Normal range of motion and neck supple.  Skin:    General: Skin is warm and dry.     Coloration: Skin is not jaundiced.  Neurological:     General: No focal deficit present.     Mental Status: She is alert and oriented to person, place, and time.  Psychiatric:        Mood and Affect: Mood normal.        Behavior: Behavior normal.      Assessment/Plan:  1.   GERD, dysphagia, indigestion-EGD scheduled for next week with possible dilation to further evaluate.  Continue on omeprazole daily for now, may make adjustments pending endoscopic findings.  2.  Colon cancer screening-at the same time as upper endoscopy, we will perform colonoscopy for colon cancer screening purposes.  3.  Change in bowels-notes ever since having cholecystectomy in 2017 she has had issues with bowels.  Seems to be chronically on the looser  side will occasionally have formed stools.  No melena hematochezia.  Possible IBS, possible bile acid induced diarrhea, discuss further on follow-up visit after colonoscopy.  Consider trial of cholestyramine  4.  Hemorrhoids-rectal exam deferred today by patient until time of colonoscopy.   05/08/2023 3:53 PM   Disclaimer: This note was dictated with voice recognition software. Similar sounding words can inadvertently be transcribed and may not be corrected upon review.

## 2023-05-10 DIAGNOSIS — Z1211 Encounter for screening for malignant neoplasm of colon: Secondary | ICD-10-CM | POA: Diagnosis not present

## 2023-05-10 DIAGNOSIS — R1319 Other dysphagia: Secondary | ICD-10-CM | POA: Diagnosis not present

## 2023-05-11 LAB — PREGNANCY, URINE: Preg Test, Ur: NEGATIVE

## 2023-05-14 ENCOUNTER — Ambulatory Visit (HOSPITAL_COMMUNITY)
Admission: RE | Admit: 2023-05-14 | Discharge: 2023-05-14 | Disposition: A | Discharge status: Home / Self Care | Payer: BC Managed Care – PPO | Attending: Internal Medicine | Admitting: Internal Medicine

## 2023-05-14 ENCOUNTER — Encounter (HOSPITAL_COMMUNITY)
Admission: RE | Disposition: A | Discharge status: Home / Self Care | Payer: Self-pay | Source: Home / Self Care | Attending: Internal Medicine

## 2023-05-14 ENCOUNTER — Encounter (HOSPITAL_COMMUNITY): Payer: Self-pay | Admitting: Internal Medicine

## 2023-05-14 ENCOUNTER — Other Ambulatory Visit: Payer: Self-pay

## 2023-05-14 ENCOUNTER — Ambulatory Visit (HOSPITAL_COMMUNITY): Payer: BC Managed Care – PPO | Admitting: Anesthesiology

## 2023-05-14 ENCOUNTER — Ambulatory Visit (HOSPITAL_COMMUNITY): Payer: Self-pay | Admitting: Anesthesiology

## 2023-05-14 DIAGNOSIS — K529 Noninfective gastroenteritis and colitis, unspecified: Secondary | ICD-10-CM | POA: Diagnosis not present

## 2023-05-14 DIAGNOSIS — I10 Essential (primary) hypertension: Secondary | ICD-10-CM | POA: Insufficient documentation

## 2023-05-14 DIAGNOSIS — K222 Esophageal obstruction: Secondary | ICD-10-CM | POA: Diagnosis not present

## 2023-05-14 DIAGNOSIS — K52832 Lymphocytic colitis: Secondary | ICD-10-CM

## 2023-05-14 DIAGNOSIS — K648 Other hemorrhoids: Secondary | ICD-10-CM | POA: Diagnosis not present

## 2023-05-14 DIAGNOSIS — K2950 Unspecified chronic gastritis without bleeding: Secondary | ICD-10-CM | POA: Diagnosis not present

## 2023-05-14 DIAGNOSIS — Z139 Encounter for screening, unspecified: Secondary | ICD-10-CM | POA: Diagnosis not present

## 2023-05-14 DIAGNOSIS — K219 Gastro-esophageal reflux disease without esophagitis: Secondary | ICD-10-CM | POA: Insufficient documentation

## 2023-05-14 DIAGNOSIS — Z800 Family history of malignant neoplasm of digestive organs: Secondary | ICD-10-CM | POA: Diagnosis not present

## 2023-05-14 DIAGNOSIS — R1310 Dysphagia, unspecified: Secondary | ICD-10-CM | POA: Diagnosis not present

## 2023-05-14 DIAGNOSIS — Z1211 Encounter for screening for malignant neoplasm of colon: Secondary | ICD-10-CM | POA: Diagnosis not present

## 2023-05-14 DIAGNOSIS — K2970 Gastritis, unspecified, without bleeding: Secondary | ICD-10-CM | POA: Diagnosis not present

## 2023-05-14 HISTORY — PX: BIOPSY: SHX5522

## 2023-05-14 HISTORY — PX: BALLOON DILATION: SHX5330

## 2023-05-14 HISTORY — PX: ESOPHAGOGASTRODUODENOSCOPY (EGD) WITH PROPOFOL: SHX5813

## 2023-05-14 HISTORY — PX: COLONOSCOPY WITH PROPOFOL: SHX5780

## 2023-05-14 SURGERY — COLONOSCOPY WITH PROPOFOL
Anesthesia: General

## 2023-05-14 MED ORDER — LACTATED RINGERS IV SOLN: INTRAVENOUS | Status: DC

## 2023-05-14 MED ORDER — PROPOFOL 10 MG/ML IV BOLUS
INTRAVENOUS | Status: DC | PRN
  Administered 2023-05-14: 60 mg via INTRAVENOUS
  Administered 2023-05-14: 50 mg via INTRAVENOUS
  Administered 2023-05-14: 40 mg via INTRAVENOUS

## 2023-05-14 MED ORDER — PROPOFOL 500 MG/50ML IV EMUL
INTRAVENOUS | Status: DC | PRN
  Administered 2023-05-14: 125 ug/kg/min via INTRAVENOUS

## 2023-05-14 MED ORDER — PHENYLEPHRINE 80 MCG/ML (10ML) SYRINGE FOR IV PUSH (FOR BLOOD PRESSURE SUPPORT)
PREFILLED_SYRINGE | INTRAVENOUS | Status: DC | PRN
  Administered 2023-05-14: 80 ug via INTRAVENOUS
  Administered 2023-05-14: 160 ug via INTRAVENOUS

## 2023-05-14 MED ORDER — PANTOPRAZOLE SODIUM 40 MG PO TBEC: 40.0000 mg | DELAYED_RELEASE_TABLET | Freq: Every day | ORAL | 11 refills | Status: DC

## 2023-05-14 MED ORDER — DEXMEDETOMIDINE HCL IN NACL 80 MCG/20ML IV SOLN
INTRAVENOUS | Status: DC | PRN
  Administered 2023-05-14: 8 ug via INTRAVENOUS

## 2023-05-14 MED ORDER — DEXMEDETOMIDINE HCL IN NACL 80 MCG/20ML IV SOLN
INTRAVENOUS | Status: AC
  Filled 2023-05-14: qty 20

## 2023-05-14 MED ORDER — LIDOCAINE HCL (CARDIAC) PF 100 MG/5ML IV SOSY
PREFILLED_SYRINGE | INTRAVENOUS | Status: DC | PRN
  Administered 2023-05-14: 100 mg via INTRAVENOUS

## 2023-05-14 NOTE — Op Note (Signed)
[facility] Patient Name: Amanda Stanley Procedure Date: 05/14/2023 9:13 AM MRN: [id number] Date of Birth: 1976/01/21 Attending MD: Hennie Duos. Marletta Lor , [place], [id number] CSN: [id number] Age: 48 Admit Type: Outpatient Procedure:                Upper GI endoscopy Indications:              Dysphagia, Heartburn Providers:                Hennie Duos. Marletta Lor, DO, Angelica Ran, Dyann Ruddle Referring MD:              Medicines:                See the Anesthesia note for documentation of the                            administered medications Complications:            No immediate complications. Estimated Blood Loss:     Estimated blood loss was minimal. Procedure:                Pre-Anesthesia Assessment:                           - The anesthesia plan was to use monitored                            anesthesia care (MAC).                           After obtaining informed consent, the endoscope was                            passed under direct vision. Throughout the                            procedure, the patient's blood pressure, pulse, and                            oxygen saturations were monitored continuously. The                            GIF-H190 ([id number]) scope was introduced through the                            mouth, and advanced to the second part of duodenum.                            The upper GI endoscopy was accomplished without                            difficulty. The patient tolerated the procedure                            well. Scope In: 9:33:10 AM Scope Out: 9:37:38 AM Total Procedure Duration: 0 hours 4 minutes 28 seconds  Findings:      A mild Schatzki ring was found in the distal  esophagus. A TTS dilator       was passed through the scope. Dilation with a 15-16.5-18 mm balloon       dilator was performed to 18 mm. The dilation site was examined and       showed mild mucosal disruption and moderate improvement in luminal       narrowing.      Patchy mild  inflammation characterized by erythema was found in the       gastric body. Biopsies were taken with a cold forceps for Helicobacter       pylori testing.      The duodenal bulb, first portion of the duodenum and second portion of       the duodenum were normal. Impression:               - Mild Schatzki ring. Dilated.                           - Gastritis. Biopsied.                           - Normal duodenal bulb, first portion of the                            duodenum and second portion of the duodenum. Moderate Sedation:      Per Anesthesia Care Recommendation:           - Patient has a contact number available for                            emergencies. The signs and symptoms of potential                            delayed complications were discussed with the                            patient. Return to normal activities tomorrow.                            Written discharge instructions were provided to the                            patient.                           - Resume previous diet.                           - Continue present medications.                           - Await pathology results.                           - Repeat upper endoscopy PRN for retreatment.                           - Return to GI clinic in 3 months.                           -  Use a proton pump inhibitor PO daily. Procedure Code(s):        --- Professional ---                           [phone], Esophagogastroduodenoscopy, flexible,                            transoral; with transendoscopic balloon dilation of                            esophagus (less than 30 mm diameter)                           [id number], 59, Esophagogastroduodenoscopy, flexible,                            transoral; with biopsy, single or multiple Diagnosis Code(s):        --- Professional ---                           K22.2, Esophageal obstruction                           K29.70, Gastritis, unspecified, without bleeding                            R13.10, Dysphagia, unspecified                           R12, Heartburn CPT copyright 2022 American Medical Association. All rights reserved. The codes documented in this report are preliminary and upon coder review may  be revised to meet current compliance requirements. Hennie Duos. Marletta Lor, DO Hennie Duos. Marletta Lor, DO 05/14/2023 9:40:19 AM This report has been signed electronically. Number of Addenda: 0

## 2023-05-14 NOTE — Anesthesia Preprocedure Evaluation (Signed)
Anesthesia Evaluation  Patient identified by MRN, date of birth, ID band Patient awake    Reviewed: Allergy & Precautions, H&P , NPO status , Patient's Chart, lab work & pertinent test results, reviewed documented beta blocker date and time   Airway Mallampati: II  TM Distance: >3 FB Neck ROM: full    Dental no notable dental hx.    Pulmonary neg pulmonary ROS   Pulmonary exam normal breath sounds clear to auscultation       Cardiovascular Exercise Tolerance: Good hypertension, negative cardio ROS  Rhythm:regular Rate:Normal     Neuro/Psych  PSYCHIATRIC DISORDERS Anxiety Depression    negative neurological ROS  negative psych ROS   GI/Hepatic negative GI ROS, Neg liver ROS,,,  Endo/Other  negative endocrine ROS    Renal/GU negative Renal ROS  negative genitourinary   Musculoskeletal   Abdominal   Peds  Hematology negative hematology ROS (+)   Anesthesia Other Findings   Reproductive/Obstetrics negative OB ROS                             Anesthesia Physical Anesthesia Plan  ASA: 2  Anesthesia Plan: General   Post-op Pain Management:    Induction:   PONV Risk Score and Plan: Propofol infusion  Airway Management Planned:   Additional Equipment:   Intra-op Plan:   Post-operative Plan:   Informed Consent: I have reviewed the patients History and Physical, chart, labs and discussed the procedure including the risks, benefits and alternatives for the proposed anesthesia with the patient or authorized representative who has indicated his/her understanding and acceptance.     Dental Advisory Given  Plan Discussed with: CRNA  Anesthesia Plan Comments:        Anesthesia Quick Evaluation  

## 2023-05-14 NOTE — Op Note (Signed)
[facility] Patient Name: Amanda Stanley Procedure Date: 05/14/2023 9:12 AM MRN: [id number] Date of Birth: 05-Aug-1975 Attending MD: Hennie Duos. Marletta Lor , [place], [id number] CSN: [id number] Age: 48 Admit Type: Outpatient Procedure:                Colonoscopy Indications:              Screening for colorectal malignant neoplasm Providers:                Hennie Duos. Marletta Lor, DO, Angelica Ran, Dyann Ruddle Referring MD:              Medicines:                See the Anesthesia note for documentation of the                            administered medications Complications:            No immediate complications. Estimated Blood Loss:     Estimated blood loss was minimal. Procedure:                Pre-Anesthesia Assessment:                           - The anesthesia plan was to use monitored                            anesthesia care (MAC).                           After obtaining informed consent, the colonoscope                            was passed under direct vision. Throughout the                            procedure, the patient's blood pressure, pulse, and                            oxygen saturations were monitored continuously. The                            PCF-HQ190L ([id number]) scope was introduced through                            the anus and advanced to the the terminal ileum,                            with identification of the appendiceal orifice and                            IC valve. The colonoscopy was performed without                            difficulty. The patient tolerated the procedure  well. The quality of the bowel preparation was                            evaluated using the BBPS [facility] Bowel Preparation                            Scale) with scores of: Right Colon = 3, Transverse                            Colon = 3 and Left Colon = 3 (entire mucosa seen                            well with no residual staining, small fragments of                             stool or opaque liquid). The total BBPS score                            equals 9. Scope In: 9:43:37 AM Scope Out: 9:53:28 AM Scope Withdrawal Time: 0 hours 8 minutes 24 seconds  Total Procedure Duration: 0 hours 9 minutes 51 seconds  Findings:      Non-bleeding internal hemorrhoids were found.      The terminal ileum appeared normal.      Biopsies for histology were taken with a cold forceps from the ascending       colon, transverse colon and descending colon for evaluation of       microscopic colitis.      Patchy mild inflammation characterized by erythema was found in the       sigmoid colon. Biopsies were taken with a cold forceps for histology.      The exam was otherwise without abnormality. Impression:               - Non-bleeding internal hemorrhoids.                           - The examined portion of the ileum was normal.                           - Patchy mild inflammation was found in the sigmoid                            colon. Biopsied.                           - The examination was otherwise normal.                           - Biopsies were taken with a cold forceps from the                            ascending colon, transverse colon and descending                            colon for evaluation of microscopic colitis.  Moderate Sedation:      Per Anesthesia Care Recommendation:           - Patient has a contact number available for                            emergencies. The signs and symptoms of potential                            delayed complications were discussed with the                            patient. Return to normal activities tomorrow.                            Written discharge instructions were provided to the                            patient.                           - Resume previous diet.                           - Continue present medications.                           - Await pathology results.                            - Repeat colonoscopy in 10 years for screening                            purposes.                           - Return to GI clinic in 3 months. Procedure Code(s):        --- Professional ---                           [phone], Colonoscopy, flexible; with biopsy, single                            or multiple Diagnosis Code(s):        --- Professional ---                           Z12.11, Encounter for screening for malignant                            neoplasm of colon                           K64.8, Other hemorrhoids                           K52.9, Noninfective gastroenteritis and colitis,  unspecified CPT copyright 2022 American Medical Association. All rights reserved. The codes documented in this report are preliminary and upon coder review may  be revised to meet current compliance requirements. Hennie Duos. Marletta Lor, DO Hennie Duos. Marletta Lor, DO 05/14/2023 9:56:11 AM This report has been signed electronically. Number of Addenda: 0

## 2023-05-14 NOTE — Interval H&P Note (Signed)
History and Physical Interval Note:  05/14/2023 8:53 AM  Amanda Stanley  has presented today for surgery, with the diagnosis of screening,dysphagia.  The various methods of treatment have been discussed with the patient and family. After consideration of risks, benefits and other options for treatment, the patient has consented to  Procedure(s) with comments: COLONOSCOPY WITH PROPOFOL (N/A) - 9:30 am, asa 2 ESOPHAGOGASTRODUODENOSCOPY (EGD) WITH PROPOFOL (N/A) - 9:30 am, asa 2 BALLOON DILATION (N/A) - 9:30 am, asa 2 as a surgical intervention.  The patient's history has been reviewed, patient examined, no change in status, stable for surgery.  I have reviewed the patient's chart and labs.  Questions were answered to the patient's satisfaction.     Lanelle Bal

## 2023-05-14 NOTE — Discharge Instructions (Addendum)
EGD Discharge instructions Please read the instructions outlined below and refer to this sheet in the next few weeks. These discharge instructions provide you with general information on caring for yourself after you leave the hospital. Your doctor may also give you specific instructions. While your treatment has been planned according to the most current medical practices available, unavoidable complications occasionally occur. If you have any problems or questions after discharge, please call your doctor. ACTIVITY You may resume your regular activity but move at a slower pace for the next 24 hours.  Take frequent rest periods for the next 24 hours.  Walking will help expel (get rid of) the air and reduce the bloated feeling in your abdomen.  No driving for 24 hours (because of the anesthesia (medicine) used during the test).  You may shower.  Do not sign any important legal documents or operate any machinery for 24 hours (because of the anesthesia used during the test).  NUTRITION Drink plenty of fluids.  You may resume your normal diet.  Begin with a light meal and progress to your normal diet.  Avoid alcoholic beverages for 24 hours or as instructed by your caregiver.  MEDICATIONS You may resume your normal medications unless your caregiver tells you otherwise.  WHAT YOU CAN EXPECT TODAY You may experience abdominal discomfort such as a feeling of fullness or "gas" pains.  FOLLOW-UP Your doctor will discuss the results of your test with you.  SEEK IMMEDIATE MEDICAL ATTENTION IF ANY OF THE FOLLOWING OCCUR: Excessive nausea (feeling sick to your stomach) and/or vomiting.  Severe abdominal pain and distention (swelling).  Trouble swallowing.  Temperature over 101 F (37.8 C).  Rectal bleeding or vomiting of blood.    Colonoscopy Discharge Instructions  Read the instructions outlined below and refer to this sheet in the next few weeks. These discharge instructions provide you with  general information on caring for yourself after you leave the hospital. Your doctor may also give you specific instructions. While your treatment has been planned according to the most current medical practices available, unavoidable complications occasionally occur.   ACTIVITY You may resume your regular activity, but move at a slower pace for the next 24 hours.  Take frequent rest periods for the next 24 hours.  Walking will help get rid of the air and reduce the bloated feeling in your belly (abdomen).  No driving for 24 hours (because of the medicine (anesthesia) used during the test).   Do not sign any important legal documents or operate any machinery for 24 hours (because of the anesthesia used during the test).  NUTRITION Drink plenty of fluids.  You may resume your normal diet as instructed by your doctor.  Begin with a light meal and progress to your normal diet. Heavy or fried foods are harder to digest and may make you feel sick to your stomach (nauseated).  Avoid alcoholic beverages for 24 hours or as instructed.  MEDICATIONS You may resume your normal medications unless your doctor tells you otherwise.  WHAT YOU CAN EXPECT TODAY Some feelings of bloating in the abdomen.  Passage of more gas than usual.  Spotting of blood in your stool or on the toilet paper.  IF YOU HAD POLYPS REMOVED DURING THE COLONOSCOPY: No aspirin products for 7 days or as instructed.  No alcohol for 7 days or as instructed.  Eat a soft diet for the next 24 hours.  FINDING OUT THE RESULTS OF YOUR TEST Not all test results are available  during your visit. If your test results are not back during the visit, make an appointment with your caregiver to find out the results. Do not assume everything is normal if you have not heard from your caregiver or the medical facility. It is important for you to follow up on all of your test results.  SEEK IMMEDIATE MEDICAL ATTENTION IF: You have more than a spotting of  blood in your stool.  Your belly is swollen (abdominal distention).  You are nauseated or vomiting.  You have a temperature over 101.  You have abdominal pain or discomfort that is severe or gets worse throughout the day.   Your EGD revealed mild amount inflammation in your stomach.  I took biopsies of this to rule out infection with a bacteria called H. pylori.    You also had a tightening of your esophagus called a Schatzki's ring.  This is benign related to chronic acid reflux.  I stretched this out today.  Hopefully this helps with feeling of food getting stuck in your esophagus.  I am going to change your omeprazole to pantoprazole 40 mg daily.  I have sent this to your pharmacy.  This medication works best if you take it at least 30 minutes before breakfast.  Your colonoscopy was relatively unremarkable.  I did not find any polyps or evidence of colon cancer.    You did have a mild amount of inflammation on the left side of your colon.  This is typically due to the colon prep itself though I did take samples of your entire colon.  We will call with results.  Follow-up in GI office in 2 to 3 months  I hope you have a great rest of your week!  Hennie Duos. Marletta Lor, D.O. Gastroenterology and Hepatology [facility]

## 2023-05-14 NOTE — Transfer of Care (Addendum)
Immediate Anesthesia Transfer of Care Note  Patient: Amanda Stanley  Procedure(s) Performed: COLONOSCOPY WITH PROPOFOL ESOPHAGOGASTRODUODENOSCOPY (EGD) WITH PROPOFOL BALLOON DILATION BIOPSY  Patient Location: Endoscopy Unit  Anesthesia Type:General  Level of Consciousness: drowsy and patient cooperative  Airway & Oxygen Therapy: Patient Spontanous Breathing and Patient connected to nasal cannula oxygen  Post-op Assessment: Report given to RN and Post -op Vital signs reviewed and stable  Post vital signs: Reviewed and stable  Last Vitals:  Vitals Value Taken Time  BP 98/56 05/14/23   1001  Temp 36.9 05/14/23   1001  Pulse 84 05/14/23   1001  Resp 15 05/14/23   1001  SpO2 99% 05/14/23   1001    Last Pain:  Vitals:   05/14/23 0928  TempSrc:   PainSc: 4       Patients Stated Pain Goal: 7 (05/14/23 4818)  Complications: No notable events documented.

## 2023-05-16 ENCOUNTER — Encounter (HOSPITAL_COMMUNITY): Payer: Self-pay | Admitting: Internal Medicine

## 2023-05-16 LAB — SURGICAL PATHOLOGY

## 2023-05-17 ENCOUNTER — Other Ambulatory Visit: Payer: Self-pay | Admitting: Internal Medicine

## 2023-05-17 MED ORDER — BUDESONIDE 3 MG PO CPEP: ORAL_CAPSULE | ORAL | 0 refills | Status: DC

## 2023-05-17 NOTE — Anesthesia Postprocedure Evaluation (Signed)
Anesthesia Post Note  Patient: Amanda Stanley  Procedure(s) Performed: COLONOSCOPY WITH PROPOFOL ESOPHAGOGASTRODUODENOSCOPY (EGD) WITH PROPOFOL BALLOON DILATION BIOPSY  Patient location during evaluation: Phase II Anesthesia Type: General Level of consciousness: awake Pain management: pain level controlled Vital Signs Assessment: post-procedure vital signs reviewed and stable Respiratory status: spontaneous breathing and respiratory function stable Cardiovascular status: blood pressure returned to baseline and stable Postop Assessment: no headache and no apparent nausea or vomiting Anesthetic complications: no Comments: Late entry   No notable events documented.   Last Vitals:  Vitals:   05/14/23 0822 05/14/23 1001  BP: 108/83 (!) 98/56  Pulse: 90 84  Resp: 16 15  Temp: 36.6 C 36.9 C  SpO2: 100% 99%    Last Pain:  Vitals:   05/14/23 1001  TempSrc: Axillary  PainSc: 0-No pain                 Windell Norfolk

## 2023-06-18 DIAGNOSIS — L700 Acne vulgaris: Secondary | ICD-10-CM | POA: Diagnosis not present

## 2023-07-24 ENCOUNTER — Encounter: Payer: Self-pay | Admitting: Internal Medicine

## 2023-08-08 ENCOUNTER — Encounter: Payer: Self-pay | Admitting: Internal Medicine

## 2023-08-29 ENCOUNTER — Telehealth: Payer: Self-pay | Admitting: Internal Medicine

## 2023-08-29 NOTE — Telephone Encounter (Signed)
 Left pt voicemail about dr Mordechai April schedule open in June as she requested.

## 2023-09-12 ENCOUNTER — Ambulatory Visit: Admitting: Gastroenterology

## 2023-10-16 ENCOUNTER — Encounter: Payer: Self-pay | Admitting: Internal Medicine

## 2023-10-16 ENCOUNTER — Ambulatory Visit: Admitting: Internal Medicine

## 2023-10-16 ENCOUNTER — Other Ambulatory Visit: Payer: Self-pay | Admitting: Internal Medicine

## 2023-10-16 VITALS — BP 125/72 | HR 89 | Temp 97.5°F | Ht 64.5 in | Wt 120.2 lb

## 2023-10-16 DIAGNOSIS — K219 Gastro-esophageal reflux disease without esophagitis: Secondary | ICD-10-CM | POA: Diagnosis not present

## 2023-10-16 DIAGNOSIS — K52832 Lymphocytic colitis: Secondary | ICD-10-CM | POA: Diagnosis not present

## 2023-10-16 DIAGNOSIS — R1319 Other dysphagia: Secondary | ICD-10-CM

## 2023-10-16 DIAGNOSIS — K648 Other hemorrhoids: Secondary | ICD-10-CM | POA: Diagnosis not present

## 2023-10-16 DIAGNOSIS — R1310 Dysphagia, unspecified: Secondary | ICD-10-CM | POA: Diagnosis not present

## 2023-10-16 MED ORDER — BUDESONIDE 3 MG PO CPEP: 9.0000 mg | ORAL_CAPSULE | Freq: Every day | ORAL | 0 refills | Status: DC

## 2023-10-16 NOTE — Progress Notes (Signed)
 Primary Care Physician:  Burney Darice CROME, MD Primary Gastroenterologist:  Dr. Cindie  Chief Complaint  Patient presents with   Follow-up    Patient here today for a follow up from recent [facility] and Egd. She says she was diagnosed with colitis at the [facility]. She is still having issues with hemorrhoids.    HPI:   Amanda Stanley is a 48 y.o. female who presents to the clinic for follow up visit.  GERD, dysphagia, indigestion-symptoms have been going on for years now.  Notes issues with heartburn and indigestion.  At 1 point was on omeprazole for a few weeks years ago.  Symptoms were not controlled on Pepcid.  EGD 05/14/2023 with Schatzki's ring the distal esophagus dilated to 18 mm.  Gastritis, normal small bowel.  Now on pantoprazole  40 mg daily.  Overall states her symptoms are improved.  Dysphagia resolved.  Lymphocytic colitis/change in bowels-notes ever since having cholecystectomy in 2017 she has had issues with bowels.  Seems to be chronically on the looser side will occasionally have formed stools.  No melena hematochezia.  She had a flare in her symptoms prompting ER visit in [place].  She states she had a CT scan which was negative.  Stool studies negative for GI pathogens.  Colonoscopy 05/14/2023 internal hemorrhoids, normal terminal ileum.  Colon biopsies with evidence of lymphocytic colitis.  Completed 40-month course of budesonide .  Overall states her symptoms are improved though now that she is off this medication they have returned to some degree.  She is somewhat hesitant to be on chronic steroids.  Does note some side effects related to the budesonide .  Hemorrhoids: Notes these flareup on occasion.  Causes irritation, occasional rectal pain.  Interested in hemorrhoid banding.  Past Medical History:  Diagnosis Date   Anxiety 2005   no meds in 10 years   Depression 2005   no meds in 10 years   Rosacea     Past Surgical History:  Procedure Laterality Date   BALLOON  DILATION N/A 05/14/2023   Procedure: BALLOON DILATION;  Surgeon: Cindie Carlin POUR, DO;  Location: [facility];  Service: Endoscopy;  Laterality: N/A;  9:30 am, asa 2   BIOPSY  05/14/2023   Procedure: BIOPSY;  Surgeon: Cindie Carlin POUR, DO;  Location: [facility];  Service: Endoscopy;;   CHOLECYSTECTOMY  09/2015    COLONOSCOPY WITH PROPOFOL  N/A 05/14/2023   Procedure: COLONOSCOPY WITH PROPOFOL ;  Surgeon: Cindie Carlin POUR, DO;  Location: [facility];  Service: Endoscopy;  Laterality: N/A;  9:30 am, asa 2   ESOPHAGOGASTRODUODENOSCOPY (EGD) WITH PROPOFOL  N/A 05/14/2023   Procedure: ESOPHAGOGASTRODUODENOSCOPY (EGD) WITH PROPOFOL ;  Surgeon: Cindie Carlin POUR, DO;  Location: [facility];  Service: Endoscopy;  Laterality: N/A;  9:30 am, asa 2   WISDOM TOOTH EXTRACTION     college    Current Outpatient Medications  Medication Sig Dispense Refill   pantoprazole  (PROTONIX ) 40 MG tablet Take 1 tablet (40 mg total) by mouth daily. 30 tablet 11   spironolactone (ALDACTONE) 100 MG tablet Take 100 mg by mouth daily.     VIENVA 0.1-20 MG-MCG tablet Take 1 tablet by mouth daily.     No current facility-administered medications for this visit.    Allergies as of 10/16/2023 - Review Complete 10/16/2023  Allergen Reaction Noted   Doxycycline Hives 10/06/2012    Family History  Problem Relation Age of Onset   Hypertension Mother    Thyroid  disease Mother    Hyperlipidemia  Mother    Heart attack Father    Osteoporosis Maternal Grandmother    Stroke Paternal Grandmother    Stroke Paternal Grandfather     Social History   Socioeconomic History   Marital status: Married    Spouse name: Not on file   Number of children: Not on file   Years of education: Not on file   Highest education level: Not on file  Occupational History   Not on file  Tobacco Use   Smoking status: Never   Smokeless tobacco: Never  Vaping Use   Vaping status: Never Used  Substance and Sexual Activity   Alcohol  use: Yes    Alcohol/week: 4.0 - 5.0 standard drinks of alcohol    Types: 4 - 5 Standard drinks or equivalent per week   Drug use: No   Sexual activity: Yes    Partners: Male    Birth control/protection: Pill  Other Topics Concern   Not on file  Social History Narrative   Not on file   Social Drivers of Health   Financial Resource Strain: Not on file  Food Insecurity: Not on file  Transportation Needs: Not on file  Physical Activity: Not on file  Stress: Not on file  Social Connections: Not on file  Intimate Partner Violence: Not on file    Subjective: Review of Systems  Constitutional:  Negative for chills and fever.  HENT:  Negative for congestion and hearing loss.   Eyes:  Negative for blurred vision and double vision.  Respiratory:  Negative for cough and shortness of breath.   Cardiovascular:  Negative for chest pain and palpitations.  Gastrointestinal:  Negative for abdominal pain, blood in stool, constipation, diarrhea, heartburn, melena and vomiting.  Genitourinary:  Negative for dysuria and urgency.  Musculoskeletal:  Negative for joint pain and myalgias.  Skin:  Negative for itching and rash.  Neurological:  Negative for dizziness and headaches.  Psychiatric/Behavioral:  Negative for depression. The patient is not nervous/anxious.        Objective: BP 125/72 (BP Location: Left Arm, Patient Position: Sitting, Cuff Size: Normal)   Pulse 89   Temp (!) 97.5 F (36.4 C) (Temporal)   Ht 5' 4.5 (1.638 m)   Wt 120 lb 3.2 oz (54.5 kg)   BMI 20.31 kg/m  Physical Exam Constitutional:      Appearance: Normal appearance.  HENT:     Head: Normocephalic and atraumatic.   Eyes:     Extraocular Movements: Extraocular movements intact.     Conjunctiva/sclera: Conjunctivae normal.    Cardiovascular:     Rate and Rhythm: Normal rate and regular rhythm.  Pulmonary:     Effort: Pulmonary effort is normal.     Breath sounds: Normal breath sounds.  Abdominal:      General: Bowel sounds are normal.     Palpations: Abdomen is soft.   Musculoskeletal:        General: No swelling. Normal range of motion.     Cervical back: Normal range of motion and neck supple.   Skin:    General: Skin is warm and dry.     Coloration: Skin is not jaundiced.   Neurological:     General: No focal deficit present.     Mental Status: She is alert and oriented to person, place, and time.   Psychiatric:        Mood and Affect: Mood normal.        Behavior: Behavior normal.  Assessment/Plan:  1.  GERD, dysphagia, indigestion-improved status post esophageal dilation.  Continue on pantoprazole  daily.  2.  Lymphocytic colitis-symptoms initially improved on budesonide .  Discussed in depth with her today possible long-term treatment for this condition.  She would like to try another course of budesonide  to achieve remission which I will send in today.  3.  Internal hemorrhoids-patient is interested in hemorrhoid banding.  We will schedule her for her first treatment today.  Otherwise follow-up with me in 2 to 3 months.   10/16/2023 3:17 PM   Disclaimer: This note was dictated with voice recognition software. Similar sounding words can inadvertently be transcribed and may not be corrected upon review.

## 2023-10-16 NOTE — Patient Instructions (Addendum)
 I am going to send in another course of budesonide  9 mg daily x 8 weeks.  We will set you up for hemorrhoid banding with Therisa Stager.  Follow-up with me in 2 to 3 months.  It was very nice seeing you again today.  Dr. Cindie

## 2023-10-21 ENCOUNTER — Other Ambulatory Visit: Payer: Self-pay | Admitting: Internal Medicine

## 2023-10-21 ENCOUNTER — Telehealth: Payer: Self-pay

## 2023-10-21 MED ORDER — BUDESONIDE 3 MG PO CPEP: 9.0000 mg | ORAL_CAPSULE | Freq: Every day | ORAL | 0 refills | Status: DC

## 2023-10-21 NOTE — Telephone Encounter (Signed)
 Pt phoned stating the pharmacy states you sent over a incomplete Rx for the pt and it needs to be corrected before they can dispense. Please advise

## 2023-10-22 DIAGNOSIS — L820 Inflamed seborrheic keratosis: Secondary | ICD-10-CM | POA: Diagnosis not present

## 2023-10-22 DIAGNOSIS — Z1283 Encounter for screening for malignant neoplasm of skin: Secondary | ICD-10-CM | POA: Diagnosis not present

## 2023-10-22 DIAGNOSIS — D2339 Other benign neoplasm of skin of other parts of face: Secondary | ICD-10-CM | POA: Diagnosis not present

## 2023-10-22 DIAGNOSIS — Z7189 Other specified counseling: Secondary | ICD-10-CM | POA: Diagnosis not present

## 2023-11-13 ENCOUNTER — Encounter: Payer: Self-pay | Admitting: Gastroenterology

## 2023-11-13 ENCOUNTER — Ambulatory Visit: Admitting: Gastroenterology

## 2023-11-13 VITALS — BP 111/68 | HR 79 | Temp 97.8°F | Ht 64.5 in | Wt 115.9 lb

## 2023-11-13 DIAGNOSIS — K641 Second degree hemorrhoids: Secondary | ICD-10-CM

## 2023-11-13 MED ORDER — HYDROCORTISONE (PERIANAL) 2.5 % EX CREA: 1.0000 | TOPICAL_CREAM | Freq: Two times a day (BID) | CUTANEOUS | 1 refills | Status: DC

## 2023-11-13 NOTE — Patient Instructions (Signed)
  Please avoid straining.  You should limit your toilet time to 2-3 minutes at the most.   Please call me with any concerns or issues!  I will see you in follow-up for additional banding in several weeks.    It was a pleasure to see you today. I want to create trusting relationships with patients and provide genuine, compassionate, and quality care. I truly value your feedback, so please be on the lookout for a survey regarding your visit with me today. I appreciate your time in completing this!         Therisa MICAEL Stager, PhD, ANP-BC [facility]

## 2023-11-13 NOTE — Progress Notes (Signed)
    [facility] BANDING PROCEDURE NOTE  Marguerite Barba is a 48 y.o. female presenting today for consideration of hemorrhoid banding. Last colonoscopy Jan 2025 with non-bleeding internal hemorrhoids, TI appeared normal, random colonic biopsies completed showing lymphocytic colitis. Completed course of budesonide . She has noted improvement with this. Occasional looser, softer stool. Symptoms include itching, burning, bleeding.    The patient presents with symptomatic grade 2 hemorrhoids, unresponsive to maximal medical therapy, requesting rubber band ligation of her hemorrhoidal disease. All risks, benefits, and alternative forms of therapy were described and informed consent was obtained.  In the left lateral decubitus position, anoscopic examination revealed grade 2 hemorrhoids in all positions.   The decision was made to band the left lateral internal hemorrhoid, and the [facility] System was used to perform band ligation without complication. Digital anorectal examination was then performed to assure proper positioning of the band, and to adjust the banded tissue as required. The patient was discharged home without pain or other issues. Dietary and behavioral recommendations were given, along with follow-up instructions. The patient will return in several weeks for followup and possible additional banding as required. Anusol  was sent to the pharmacy.   No complications were encountered and the patient tolerated the procedure well.   Therisa MICAEL Stager, PhD, ANP-BC [facility]

## 2023-12-04 ENCOUNTER — Ambulatory Visit: Admitting: Gastroenterology

## 2023-12-04 ENCOUNTER — Other Ambulatory Visit: Payer: Self-pay

## 2023-12-04 ENCOUNTER — Encounter: Payer: Self-pay | Admitting: Gastroenterology

## 2023-12-04 VITALS — BP 107/71 | HR 75 | Temp 97.8°F | Ht 64.5 in | Wt 117.0 lb

## 2023-12-04 DIAGNOSIS — K641 Second degree hemorrhoids: Secondary | ICD-10-CM

## 2023-12-04 MED ORDER — HYDROCORTISONE (PERIANAL) 2.5 % EX CREA
1.0000 | TOPICAL_CREAM | Freq: Two times a day (BID) | CUTANEOUS | 1 refills | Status: AC
Start: 2023-12-04 — End: ?

## 2023-12-04 NOTE — Patient Instructions (Signed)
   Please avoid straining.  You should limit your toilet time to 2-3 minutes at the most.    Please call me with any concerns or issues!  I will see you in follow-up for additional banding in several weeks.     I enjoyed seeing you again today! I value our relationship and want to provide genuine, compassionate, and quality care. You may receive a survey regarding your visit with me, and I welcome your feedback! Thanks so much for taking the time to complete this. I look forward to seeing you again.      Therisa MICAEL Stager, PhD, ANP-BC [facility]

## 2023-12-04 NOTE — Progress Notes (Signed)
    [facility] BANDING PROCEDURE NOTE  Amanda Stanley is a 48 y.o. female presenting today for consideration of hemorrhoid banding. Last colonoscopy Jan 2025 with non-bleeding internal hemorrhoids, TI appeared normal, random colonic biopsies completed showing lymphocytic colitis. Completed course of budesonide . Main symptoms have been itching, burning, bleeding. She had left lateral banding on 7/23. She has had some improvement in symptoms. No difficulties with prior banding.    The patient presents with symptomatic grade 2 hemorrhoids, unresponsive to maximal medical therapy, requesting rubber band ligation of her hemorrhoidal disease. All risks, benefits, and alternative forms of therapy were described and informed consent was obtained.  The decision was made to band the right posterior internal hemorrhoid, and the [facility] O'Regan System was used to perform band ligation without complication. Digital anorectal examination was then performed to assure proper positioning of the band, and to adjust the banded tissue as required. The patient was discharged home without pain or other issues. Dietary and behavioral recommendations were given, along with follow-up instructions. The patient will return in several weeks for followup and possible additional banding as required.  No complications were encountered and the patient tolerated the procedure well.   Therisa MICAEL Stager, PhD, ANP-BC [facility]

## 2023-12-11 ENCOUNTER — Ambulatory Visit: Admitting: Internal Medicine

## 2023-12-11 ENCOUNTER — Encounter: Payer: Self-pay | Admitting: Internal Medicine

## 2023-12-11 VITALS — BP 99/63 | HR 60 | Temp 98.1°F | Ht 64.5 in | Wt 117.9 lb

## 2023-12-11 DIAGNOSIS — R1319 Other dysphagia: Secondary | ICD-10-CM

## 2023-12-11 DIAGNOSIS — K219 Gastro-esophageal reflux disease without esophagitis: Secondary | ICD-10-CM | POA: Diagnosis not present

## 2023-12-11 DIAGNOSIS — K52832 Lymphocytic colitis: Secondary | ICD-10-CM | POA: Diagnosis not present

## 2023-12-11 DIAGNOSIS — R197 Diarrhea, unspecified: Secondary | ICD-10-CM

## 2023-12-11 DIAGNOSIS — K641 Second degree hemorrhoids: Secondary | ICD-10-CM

## 2023-12-11 NOTE — Progress Notes (Signed)
 Primary Care Physician:  Burney Darice CROME, MD Primary Gastroenterologist:  Dr. Cindie  Chief Complaint  Patient presents with   Follow-up    Pt arrives for follow up. Pt was placed on Budesonide  and states it did not help. Pt still having some symptoms (upset stomach and diarrhea) Pt would like to discuss other options for Budesonide      HPI:   Amanda Stanley is a 48 y.o. female who presents to the clinic for follow up visit.  GERD, dysphagia, indigestion-symptoms have been going on for years now.  Notes issues with heartburn and indigestion.  At 1 point was on omeprazole for a few weeks years ago.  Symptoms were not controlled on Pepcid.  EGD 05/14/2023 with Schatzki's ring the distal esophagus dilated to 18 mm.  Gastritis, normal small bowel.  Now on pantoprazole  40 mg daily.  Overall states her symptoms are improved.  Dysphagia resolved.  Lymphocytic colitis/change in bowels-notes ever since having cholecystectomy in 2017 she has had issues with bowels.  Seems to be chronically on the looser side will occasionally have formed stools.  No melena hematochezia.  She had a flare in her symptoms prompting ER visit in [place].  She states she had a CT scan which was negative.  Stool studies negative for GI pathogens.  Colonoscopy 05/14/2023 internal hemorrhoids, normal terminal ileum.  Colon biopsies with evidence of lymphocytic colitis.  Completed 35-month course of budesonide .  Overall states her symptoms are improved though after stopping her medication, her symptoms have returned to some degree.  She is somewhat hesitant to be on chronic steroids.  Does note some side effects related to the budesonide .  Was given a second treatment course of budesonide  which she has since completed.  Today states her symptoms have overall improved.  Averaging 2-3 bowel movements a day, sometimes formed.  Approximately 1 time a month she will have a flare where she will have diarrhea numerous times a  day for which she takes Imodium.  Hemorrhoids: Notes these flareup on occasion.  Causes irritation, occasional rectal pain.  Has completed 2 sessions of hemorrhoid banding.  Past Medical History:  Diagnosis Date   Anxiety 2005   no meds in 10 years   Depression 2005   no meds in 10 years   Rosacea     Past Surgical History:  Procedure Laterality Date   BALLOON DILATION N/A 05/14/2023   Procedure: BALLOON DILATION;  Surgeon: Cindie Carlin POUR, DO;  [facility];  Service: Endoscopy;  Laterality: N/A;  9:30 am, asa 2   BIOPSY  05/14/2023   Procedure: BIOPSY;  Surgeon: Cindie Carlin POUR, DO;  [facility];  Service: Endoscopy;;   CHOLECYSTECTOMY  09/2015    COLONOSCOPY WITH PROPOFOL  N/A 05/14/2023   Procedure: COLONOSCOPY WITH PROPOFOL ;  Surgeon: Cindie Carlin POUR, DO;  [facility];  Service: Endoscopy;  Laterality: N/A;  9:30 am, asa 2   ESOPHAGOGASTRODUODENOSCOPY (EGD) WITH PROPOFOL  N/A 05/14/2023   Procedure: ESOPHAGOGASTRODUODENOSCOPY (EGD) WITH PROPOFOL ;  Surgeon: Cindie Carlin POUR, DO;  [facility];  Service: Endoscopy;  Laterality: N/A;  9:30 am, asa 2   WISDOM TOOTH EXTRACTION     college    Current Outpatient Medications  Medication Sig Dispense Refill   hydrocortisone  (ANUSOL -HC) 2.5 % rectal cream Place 1 Application rectally 2 (two) times daily. 30 g 1   pantoprazole  (PROTONIX ) 40 MG tablet Take 1 tablet (40 mg total) by mouth daily. 30 tablet 11  spironolactone (ALDACTONE) 100 MG tablet Take 100 mg by mouth daily.     VIENVA 0.1-20 MG-MCG tablet Take 1 tablet by mouth daily.     No current facility-administered medications for this visit.    Allergies as of 12/11/2023 - Review Complete 12/11/2023  Allergen Reaction Noted   Doxycycline Hives 10/06/2012    Family History  Problem Relation Age of Onset   Hypertension Mother    Thyroid  disease Mother    Hyperlipidemia Mother    Heart attack Father    Osteoporosis  Maternal Grandmother    Stroke Paternal Grandmother    Stroke Paternal Grandfather     Social History   Socioeconomic History   Marital status: Married    Spouse name: Not on file   Number of children: Not on file   Years of education: Not on file   Highest education level: Not on file  Occupational History   Not on file  Tobacco Use   Smoking status: Never   Smokeless tobacco: Never  Vaping Use   Vaping status: Never Used  Substance and Sexual Activity   Alcohol use: Yes    Alcohol/week: 4.0 - 5.0 standard drinks of alcohol    Types: 4 - 5 Standard drinks or equivalent per week    Comment: occasionally   Drug use: No   Sexual activity: Yes    Partners: Male    Birth control/protection: Pill  Other Topics Concern   Not on file  Social History Narrative   Not on file   Social Drivers of Health   Financial Resource Strain: Not on file  Food Insecurity: Not on file  Transportation Needs: Not on file  Physical Activity: Not on file  Stress: Not on file  Social Connections: Not on file  Intimate Partner Violence: Not on file    Subjective: Review of Systems  Constitutional:  Negative for chills and fever.  HENT:  Negative for congestion and hearing loss.   Eyes:  Negative for blurred vision and double vision.  Respiratory:  Negative for cough and shortness of breath.   Cardiovascular:  Negative for chest pain and palpitations.  Gastrointestinal:  Negative for abdominal pain, blood in stool, constipation, diarrhea, heartburn, melena and vomiting.  Genitourinary:  Negative for dysuria and urgency.  Musculoskeletal:  Negative for joint pain and myalgias.  Skin:  Negative for itching and rash.  Neurological:  Negative for dizziness and headaches.  Psychiatric/Behavioral:  Negative for depression. The patient is not nervous/anxious.        Objective: BP 99/63   Pulse 60   Temp 98.1 F (36.7 C)   Ht 5' 4.5 (1.638 m)   Wt 117 lb 14.4 oz (53.5 kg)   LMP  11/13/2023   BMI 19.92 kg/m  Physical Exam Constitutional:      Appearance: Normal appearance.  HENT:     Head: Normocephalic and atraumatic.  Eyes:     Extraocular Movements: Extraocular movements intact.     Conjunctiva/sclera: Conjunctivae normal.  Cardiovascular:     Rate and Rhythm: Normal rate and regular rhythm.  Pulmonary:     Effort: Pulmonary effort is normal.     Breath sounds: Normal breath sounds.  Abdominal:     General: Bowel sounds are normal.     Palpations: Abdomen is soft.  Musculoskeletal:        General: No swelling. Normal range of motion.     Cervical back: Normal range of motion and neck supple.  Skin:  General: Skin is warm and dry.     Coloration: Skin is not jaundiced.  Neurological:     General: No focal deficit present.     Mental Status: She is alert and oriented to person, place, and time.  Psychiatric:        Mood and Affect: Mood normal.        Behavior: Behavior normal.      Assessment/Plan:  1.  GERD, dysphagia, indigestion-improved status post esophageal dilation.  Continue on pantoprazole  daily.  2.  Lymphocytic colitis-overall symptoms are improved with 2 courses of budesonide .  As far as her flares once a month, possibly related to other causes including bile acid induced diarrhea, IBS-D.  Offered course of cholestyramine.  She would like to hold off for now.  She says overall her symptoms are well-controlled.  Continue Imodium as needed for her flareups.  3.  Internal hemorrhoids-currently undergoing banding.  Has completed 2 banding sessions today.  Follow-up with me in 3 to 4 months.   12/11/2023 11:06 AM   Disclaimer: This note was dictated with voice recognition software. Similar sounding words can inadvertently be transcribed and may not be corrected upon review.

## 2023-12-11 NOTE — Patient Instructions (Signed)
 We will continue to monitor your symptoms for now.  Continue Imodium as needed for flareups.  I will print you out information regarding a low FODMAP diet which may give you some insight on certain foods to avoid.  Follow-up in 3 to 4 months.  I hope you have a great trip to [place].  Dr. Cindie

## 2023-12-19 DIAGNOSIS — L700 Acne vulgaris: Secondary | ICD-10-CM | POA: Diagnosis not present

## 2024-01-23 ENCOUNTER — Encounter: Payer: Self-pay | Admitting: Gastroenterology

## 2024-01-23 ENCOUNTER — Ambulatory Visit (INDEPENDENT_AMBULATORY_CARE_PROVIDER_SITE_OTHER): Admitting: Gastroenterology

## 2024-01-23 VITALS — BP 107/71 | HR 65 | Temp 97.4°F | Ht 64.5 in | Wt 119.6 lb

## 2024-01-23 DIAGNOSIS — K641 Second degree hemorrhoids: Secondary | ICD-10-CM

## 2024-01-23 NOTE — Progress Notes (Signed)
    [facility] BANDING PROCEDURE NOTE  Amanda Stanley is a 48 y.o. female presenting today for consideration of hemorrhoid banding. Last colonoscopy  Jan 2025 with non-bleeding internal hemorrhoids, TI appeared normal, random colonic biopsies completed showing lymphocytic colitis.  She has completed left lateral and right posterior banding thus far. She had noted improvement since last visit until last week when she had bleeding and felt like something burst.    The patient presents with symptomatic grade 2 hemorrhoids, unresponsive to maximal medical therapy, requesting rubber band ligation of her hemorrhoidal disease. All risks, benefits, and alternative forms of therapy were described and informed consent was obtained.   The decision was made to band the right anterior internal hemorrhoid, and the [facility] O'Regan System was used to perform band ligation without complication. Digital anorectal examination was then performed to assure proper positioning of the band, and to adjust the banded tissue as required. The patient was discharged home without pain or other issues. Dietary and behavioral recommendations were given, along with follow-up instructions. The patient will return as needed. She will reach out if she feels she could benefit from neutral banding.   No complications were encountered and the patient tolerated the procedure well.   Therisa MICAEL Stager, PhD, ANP-BC [facility]

## 2024-01-23 NOTE — Patient Instructions (Signed)
  Please avoid straining.  You should limit your toilet time to 2-3 minutes at the most.    Please call me with any concerns or issues! Sometimes, we need to add an additional band if any redundant tissue remains. You can message me and let me know!  I enjoyed seeing you again today! I value our relationship and want to provide genuine, compassionate, and quality care. You may receive a survey regarding your visit with me, and I welcome your feedback! Thanks so much for taking the time to complete this. I look forward to seeing you again.      Therisa MICAEL Stager, PhD, ANP-BC [facility]

## 2024-01-30 DIAGNOSIS — Z124 Encounter for screening for malignant neoplasm of cervix: Secondary | ICD-10-CM | POA: Diagnosis not present

## 2024-01-30 DIAGNOSIS — Z01419 Encounter for gynecological examination (general) (routine) without abnormal findings: Secondary | ICD-10-CM | POA: Diagnosis not present

## 2024-01-30 DIAGNOSIS — Z1231 Encounter for screening mammogram for malignant neoplasm of breast: Secondary | ICD-10-CM | POA: Diagnosis not present

## 2024-03-10 ENCOUNTER — Encounter: Payer: Self-pay | Admitting: Gastroenterology

## 2024-03-10 ENCOUNTER — Ambulatory Visit: Admitting: Gastroenterology

## 2024-03-10 VITALS — BP 120/66 | HR 81 | Temp 98.4°F | Ht 64.5 in | Wt 120.7 lb

## 2024-03-10 DIAGNOSIS — K602 Anal fissure, unspecified: Secondary | ICD-10-CM | POA: Diagnosis not present

## 2024-03-10 MED ORDER — DILTIAZEM GEL 2 %: 1.0000 | Freq: Two times a day (BID) | CUTANEOUS | 2 refills | Status: DC

## 2024-03-10 NOTE — Progress Notes (Signed)
 Gastroenterology Office Note     Primary Care Physician:  Burney Darice CROME, MD  Primary Gastroenterologist: DR Cindie   Chief Complaint   Chief Complaint  Patient presents with   Hemorrhoids    Patient here today due to having issues with painful hemorrhoids.Patient has issues with bleeding and itching hemorrhoids. This will be her fourth banding.      History of Present Illness   Amanda Stanley is a 48 y.o. female presenting today with a history of internal hemorrhoids s/p banding.  Last colonoscopy  Jan 2025 with non-bleeding internal hemorrhoids, TI appeared normal, random colonic biopsies completed showing lymphocytic colitis.  She has completed left lateral , right posterior banding, and right anterior. Last banding in Oct 2025.  Returns today with rectal pain. Notes pain with BMs, like a cutting. Intermittent low volume bleeding. No constipation. Itching at times. No diarrhea.        Past Medical History:  Diagnosis Date   Anxiety 2005   no meds in 10 years   Depression 2005   no meds in 10 years   Rosacea     Past Surgical History:  Procedure Laterality Date   BALLOON DILATION N/A 05/14/2023   Procedure: BALLOON DILATION;  Surgeon: Cindie Carlin POUR, DO;  [facility];  Service: Endoscopy;  Laterality: N/A;  9:30 am, asa 2   BIOPSY  05/14/2023   Procedure: BIOPSY;  Surgeon: Cindie Carlin POUR, DO;  [facility];  Service: Endoscopy;;   CHOLECYSTECTOMY  09/2015    COLONOSCOPY WITH PROPOFOL  N/A 05/14/2023   Procedure: COLONOSCOPY WITH PROPOFOL ;  Surgeon: Cindie Carlin POUR, DO;  [facility];  Service: Endoscopy;  Laterality: N/A;  9:30 am, asa 2   ESOPHAGOGASTRODUODENOSCOPY (EGD) WITH PROPOFOL  N/A 05/14/2023   Procedure: ESOPHAGOGASTRODUODENOSCOPY (EGD) WITH PROPOFOL ;  Surgeon: Cindie Carlin POUR, DO;  [facility];  Service: Endoscopy;  Laterality: N/A;  9:30 am, asa 2   WISDOM TOOTH EXTRACTION     college     Current Outpatient Medications  Medication Sig Dispense Refill   hydrocortisone  (ANUSOL -HC) 2.5 % rectal cream Place 1 Application rectally 2 (two) times daily. 30 g 1   pantoprazole  (PROTONIX ) 40 MG tablet Take 1 tablet (40 mg total) by mouth daily. 30 tablet 11   spironolactone (ALDACTONE) 100 MG tablet Take 100 mg by mouth daily.     tretinoin (RETIN-A) 0.05 % cream Apply 1 Application topically at bedtime.     VIENVA 0.1-20 MG-MCG tablet Take 1 tablet by mouth daily.     diltiazem  2 % GEL Apply 1 Application topically 2 (two) times daily. To rectum 30 g 2   Nitroglycerin  0.4 % OINT Place 1 Application rectally 2 (two) times daily. Use small amount, wear gloves, and wash hands thereafter. 30 g 1   No current facility-administered medications for this visit.    Allergies as of 03/10/2024 - Review Complete 03/10/2024  Allergen Reaction Noted   Doxycycline Hives 10/06/2012    Family History  Problem Relation Age of Onset   Hypertension Mother    Thyroid  disease Mother    Hyperlipidemia Mother    Heart attack Father    Osteoporosis Maternal Grandmother    Stroke Paternal Grandmother    Stroke Paternal Grandfather     Social History   Socioeconomic History   Marital status: Married    Spouse name: Not on file   Number of children: Not on file   Years  of education: Not on file   Highest education level: Not on file  Occupational History   Not on file  Tobacco Use   Smoking status: Never   Smokeless tobacco: Never  Vaping Use   Vaping status: Never Used  Substance and Sexual Activity   Alcohol use: Yes    Alcohol/week: 4.0 - 5.0 standard drinks of alcohol    Types: 4 - 5 Standard drinks or equivalent per week    Comment: occasionally   Drug use: No   Sexual activity: Yes    Partners: Male    Birth control/protection: Pill  Other Topics Concern   Not on file  Social History Narrative   Not on file   Social Drivers of Health   Financial Resource Strain: Not  on file  Food Insecurity: Not on file  Transportation Needs: Not on file  Physical Activity: Not on file  Stress: Not on file  Social Connections: Not on file  Intimate Partner Violence: Not on file     Review of Systems   Gen: Denies any fever, chills, fatigue, weight loss, lack of appetite.  CV: Denies chest pain, heart palpitations, peripheral edema, syncope.  Resp: Denies shortness of breath at rest or with exertion. Denies wheezing or cough.  GI: Denies dysphagia or odynophagia. Denies jaundice, hematemesis, fecal incontinence. GU : Denies urinary burning, urinary frequency, urinary hesitancy MS: Denies joint pain, muscle weakness, cramps, or limitation of movement.  Derm: Denies rash, itching, dry skin Psych: Denies depression, anxiety, memory loss, and confusion Heme: Denies bruising, bleeding, and enlarged lymph nodes.   Physical Exam   BP 120/66 (BP Location: Left Arm, Patient Position: Sitting, Cuff Size: Normal)   Pulse 81   Temp 98.4 F (36.9 C) (Temporal)   Ht 5' 4.5 (1.638 m)   Wt 120 lb 11.2 oz (54.7 kg)   BMI 20.40 kg/m  General:   Alert and oriented. Pleasant and cooperative. Well-nourished and well-developed.  Rectal:  Left anterior fissure, no thrombosed hemorrhoids Neurologic:  Alert and  oriented x4;  grossly normal neurologically. Skin:  Intact without significant lesions or rashes. Psych:  Alert and cooperative. Normal mood and affect.   Assessment/Plan   Anal fissure: diltiazem  ointment provided. Discussed using nitroglycerin  potentially but will avoid due to hx of headaches unless necessary. Discussed calling this into [place] for compounding or [employer], but she prefers to pick up locally.   Call if no improvement or unable to fill.  Return in 3 months     Therisa MICAEL Stager, PhD, [facility]

## 2024-03-10 NOTE — Patient Instructions (Signed)
 Apply the diltiazem gel twice a day just inside rectum, pea-sized amount. You can alternate with the Anusol  (steroid) cream as needed.   We will see you in 3 months, and please message if the pharmacy does not carry this or we need to send to another pharmacy.   Have a wonderful birthday upcoming!   I enjoyed seeing you again today! I value our relationship and want to provide genuine, compassionate, and quality care. You may receive a survey regarding your visit with me, and I welcome your feedback! Thanks so much for taking the time to complete this. I look forward to seeing you again.      Therisa MICAEL Stager, PhD, ANP-BC [facility]

## 2024-03-12 MED ORDER — NITROGLYCERIN 0.4 % RE OINT: 1.0000 | TOPICAL_OINTMENT | Freq: Two times a day (BID) | RECTAL | 1 refills | Status: DC

## 2024-04-02 ENCOUNTER — Telehealth: Payer: Self-pay | Admitting: Internal Medicine

## 2024-04-02 NOTE — Telephone Encounter (Signed)
 Left pt a message to call and get scheduled in Jan. With Dr. Cindie for a fu appt - DR. CARVER ONLY

## 2024-04-19 ENCOUNTER — Other Ambulatory Visit: Payer: Self-pay | Admitting: Internal Medicine

## 2024-05-04 ENCOUNTER — Telehealth: Payer: Self-pay

## 2024-05-04 ENCOUNTER — Encounter: Payer: Self-pay | Admitting: Internal Medicine

## 2024-05-04 ENCOUNTER — Encounter (INDEPENDENT_AMBULATORY_CARE_PROVIDER_SITE_OTHER): Payer: Self-pay

## 2024-05-04 ENCOUNTER — Ambulatory Visit: Admitting: Internal Medicine

## 2024-05-04 VITALS — BP 121/75 | HR 78 | Temp 98.6°F | Ht 64.5 in | Wt 121.1 lb

## 2024-05-04 DIAGNOSIS — R1319 Other dysphagia: Secondary | ICD-10-CM

## 2024-05-04 DIAGNOSIS — K648 Other hemorrhoids: Secondary | ICD-10-CM | POA: Diagnosis not present

## 2024-05-04 DIAGNOSIS — R197 Diarrhea, unspecified: Secondary | ICD-10-CM

## 2024-05-04 DIAGNOSIS — R1310 Dysphagia, unspecified: Secondary | ICD-10-CM | POA: Diagnosis not present

## 2024-05-04 DIAGNOSIS — K52832 Lymphocytic colitis: Secondary | ICD-10-CM | POA: Insufficient documentation

## 2024-05-04 DIAGNOSIS — K219 Gastro-esophageal reflux disease without esophagitis: Secondary | ICD-10-CM

## 2024-05-04 DIAGNOSIS — K580 Irritable bowel syndrome with diarrhea: Secondary | ICD-10-CM | POA: Insufficient documentation

## 2024-05-04 DIAGNOSIS — K9089 Other intestinal malabsorption: Secondary | ICD-10-CM | POA: Insufficient documentation

## 2024-05-04 MED ORDER — CHOLESTYRAMINE 4 GM/DOSE PO POWD: 4.0000 g | Freq: Every day | ORAL | 0 refills | Status: AC

## 2024-05-04 MED ORDER — PANTOPRAZOLE SODIUM 40 MG PO TBEC: 40.0000 mg | DELAYED_RELEASE_TABLET | Freq: Every day | ORAL | 3 refills | Status: AC

## 2024-05-04 NOTE — Telephone Encounter (Signed)
 Pt has a PA in [employer]. The KEY is [id number]. SHE was seen @ 10:30 this morning

## 2024-05-04 NOTE — Progress Notes (Signed)
 "   Primary Care Physician:  Burney Darice CROME, MD Primary Gastroenterologist:  Dr. Cindie  Chief Complaint  Patient presents with   Follow-up    Patient here today for a follow up on Lymphocytic colitis, and reflux, and hemorrhoids. Patient denies any current issues, though she does says she has occasional flare up of the colitis with diarrhea, and upset stomach, lower abdominal pain. Reflux is controlled on pantoprazole  40 mg daily. Reports she has occasional issues with irritation and bright red blood or pink tissues with wiping. She is using anusol  bid prn flare.      HPI:   Amanda Stanley is a 49 y.o. female who presents to the clinic for follow up visit.  GERD, dysphagia, indigestion-symptoms have been going on for years now.  Notes issues with heartburn and indigestion.  At 1 point was on omeprazole for a few weeks years ago.  Symptoms were not controlled on Pepcid.  EGD 05/14/2023 with Schatzki's ring the distal esophagus dilated to 18 mm.  Gastritis, normal small bowel.  Now on pantoprazole  40 mg daily.  Overall states her symptoms are improved.  Dysphagia resolved.  Lymphocytic colitis/change in bowels-notes ever since having cholecystectomy in 2017 she has had issues with bowels.  Seems to be chronically on the looser side will occasionally have formed stools.  No melena hematochezia.  She had a flare in her symptoms prompting ER visit in [place].  She states she had a CT scan which was negative.  Stool studies negative for GI pathogens.  Colonoscopy 05/14/2023 internal hemorrhoids, normal terminal ileum.  Colon biopsies with evidence of lymphocytic colitis.  Completed 59-month course of budesonide .  Overall states her symptoms are improved though after stopping her medication, her symptoms have returned to some degree.    Was given a second treatment course of budesonide  which she completed.   Today states her symptoms have overall improved.  Averaging 2-3 bowel movements a  day, sometimes formed.  Reports 1 flare  in the last 6 months where she had diarrhea numerous times a day for which she took budesonide  and imodium x10 days and her symptoms improved.   Hemorrhoids: Notes these flareup on occasion.  Causes irritation, occasional rectal pain.  Has completed hemorrhoid banding x3. Anala fissure diagnosed 02/2024 treated with topical diltiazem .   Past Medical History:  Diagnosis Date   Anxiety 2005   no meds in 10 years   Depression 2005   no meds in 10 years   Rosacea     Past Surgical History:  Procedure Laterality Date   BALLOON DILATION N/A 05/14/2023   Procedure: BALLOON DILATION;  Surgeon: Cindie Carlin POUR, DO;  Location: [facility];  Service: Endoscopy;  Laterality: N/A;  9:30 am, asa 2   BIOPSY  05/14/2023   Procedure: BIOPSY;  Surgeon: Cindie Carlin POUR, DO;  Location: [facility];  Service: Endoscopy;;   CHOLECYSTECTOMY  09/2015    COLONOSCOPY WITH PROPOFOL  N/A 05/14/2023   Procedure: COLONOSCOPY WITH PROPOFOL ;  Surgeon: Cindie Carlin POUR, DO;  Location: [facility];  Service: Endoscopy;  Laterality: N/A;  9:30 am, asa 2   ESOPHAGOGASTRODUODENOSCOPY (EGD) WITH PROPOFOL  N/A 05/14/2023   Procedure: ESOPHAGOGASTRODUODENOSCOPY (EGD) WITH PROPOFOL ;  Surgeon: Cindie Carlin POUR, DO;  Location: [facility];  Service: Endoscopy;  Laterality: N/A;  9:30 am, asa 2   WISDOM TOOTH EXTRACTION     college    Current Outpatient Medications  Medication Sig Dispense Refill   hydrocortisone  (ANUSOL -HC) 2.5 %  rectal cream Place 1 Application rectally 2 (two) times daily. 30 g 1   pantoprazole  (PROTONIX ) 40 MG tablet TAKE ONE TABLET BY MOUTH EVERY DAY 30 tablet 2   spironolactone (ALDACTONE) 100 MG tablet Take 100 mg by mouth daily.     tretinoin (RETIN-A) 0.05 % cream Apply 1 Application topically at bedtime.     VIENVA 0.1-20 MG-MCG tablet Take 1 tablet by mouth daily.     diltiazem  2 % GEL Apply 1 Application topically 2 (two) times daily. To rectum  (Patient not taking: Reported on 05/04/2024) 30 g 2   Nitroglycerin  0.4 % OINT Place 1 Application rectally 2 (two) times daily. Use small amount, wear gloves, and wash hands thereafter. (Patient not taking: Reported on 05/04/2024) 30 g 1   No current facility-administered medications for this visit.    Allergies as of 05/04/2024 - Review Complete 05/04/2024  Allergen Reaction Noted   Doxycycline Hives 10/06/2012    Family History  Problem Relation Age of Onset   Hypertension Mother    Thyroid  disease Mother    Hyperlipidemia Mother    Heart attack Father    Osteoporosis Maternal Grandmother    Stroke Paternal Grandmother    Stroke Paternal Grandfather     Social History   Socioeconomic History   Marital status: Married    Spouse name: Not on file   Number of children: Not on file   Years of education: Not on file   Highest education level: Not on file  Occupational History   Not on file  Tobacco Use   Smoking status: Never   Smokeless tobacco: Never  Vaping Use   Vaping status: Never Used  Substance and Sexual Activity   Alcohol use: Yes    Alcohol/week: 4.0 - 5.0 standard drinks of alcohol    Types: 4 - 5 Standard drinks or equivalent per week    Comment: occasionally   Drug use: No   Sexual activity: Yes    Partners: Male    Birth control/protection: Pill  Other Topics Concern   Not on file  Social History Narrative   Not on file   Social Drivers of Health   Tobacco Use: Low Risk (05/04/2024)   Patient History    Smoking Tobacco Use: Never    Smokeless Tobacco Use: Never    Passive Exposure: Not on file  Financial Resource Strain: Not on file  Food Insecurity: Not on file  Transportation Needs: Not on file  Physical Activity: Not on file  Stress: Not on file  Social Connections: Not on file  Intimate Partner Violence: Not on file  Depression (PHQ2-9): Low Risk (09/26/2021)   Depression (PHQ2-9)    PHQ-2 Score: 0  Alcohol Screen: Not on file  Housing:  Not on file  Utilities: Not on file  Health Literacy: Not on file    Subjective: Review of Systems  Constitutional:  Negative for chills and fever.  HENT:  Negative for congestion and hearing loss.   Eyes:  Negative for blurred vision and double vision.  Respiratory:  Negative for cough and shortness of breath.   Cardiovascular:  Negative for chest pain and palpitations.  Gastrointestinal:  Negative for abdominal pain, blood in stool, constipation, diarrhea, heartburn, melena and vomiting.  Genitourinary:  Negative for dysuria and urgency.  Musculoskeletal:  Negative for joint pain and myalgias.  Skin:  Negative for itching and rash.  Neurological:  Negative for dizziness and headaches.  Psychiatric/Behavioral:  Negative for depression. The patient is  not nervous/anxious.        Objective: BP 121/75 (BP Location: Left Arm, Patient Position: Sitting, Cuff Size: Normal)   Pulse 78   Temp 98.6 F (37 C) (Temporal)   Ht 5' 4.5 (1.638 m)   Wt 121 lb 1.6 oz (54.9 kg)   BMI 20.47 kg/m  Physical Exam Constitutional:      Appearance: Normal appearance.  HENT:     Head: Normocephalic and atraumatic.  Eyes:     Extraocular Movements: Extraocular movements intact.     Conjunctiva/sclera: Conjunctivae normal.  Cardiovascular:     Rate and Rhythm: Normal rate and regular rhythm.  Pulmonary:     Effort: Pulmonary effort is normal.     Breath sounds: Normal breath sounds.  Abdominal:     General: Bowel sounds are normal.     Palpations: Abdomen is soft.  Musculoskeletal:        General: No swelling. Normal range of motion.     Cervical back: Normal range of motion and neck supple.  Skin:    General: Skin is warm and dry.     Coloration: Skin is not jaundiced.  Neurological:     General: No focal deficit present.     Mental Status: She is alert and oriented to person, place, and time.  Psychiatric:        Mood and Affect: Mood normal.        Behavior: Behavior normal.       Assessment/Plan:  1.  GERD, dysphagia, indigestion-improved status post esophageal dilation.  Continue on pantoprazole  daily.  2.  Lymphocytic colitis-overall symptoms are improved with 2 courses of budesonide .  As far as her occasional flares, possibly related to other causes including bile acid induced diarrhea, IBS-D.  1 flare up in 6 months for which she took budesonide , imodium x10 days and resolved. She has budesonide  on hand if this happens again though she would prefer a different option as this caused some significant hot flashes.  Cholestyramine  sent to pharmacy to take as needed if she flares up again.  Counseled she will need to wait least 4 hours after her morning meds before taking this medication.  3.  Internal hemorrhoids- s/p hemorrhoid banding x3. Improved, continue to monitor.    Follow-up 6 months.   05/04/2024 10:39 AM   Disclaimer: This note was dictated with voice recognition software. Similar sounding words can inadvertently be transcribed and may not be corrected upon review.  "

## 2024-05-04 NOTE — Patient Instructions (Signed)
 I am happy to hear that you are doing well.  Continue on pantoprazole  for your chronic acid reflux and indigestion.  I sent in refills to your pharmacy today.  In regards to your lymphocytic colitis, I will send in cholestyramine  to take if you flareup again.  You will need to wait at least 4 hours after your morning meds before taking this medication.  Follow-up in 6 months or sooner if needed.  It was very nice seeing you again today.  Dr. Cindie

## 2024-05-04 NOTE — Telephone Encounter (Signed)
 noted

## 2024-05-04 NOTE — Telephone Encounter (Signed)
 Ok Thanks,

## 2024-05-05 NOTE — Telephone Encounter (Signed)
 Prior authorization submitted to the patient's plan.

## 2024-05-14 ENCOUNTER — Ambulatory Visit: Admitting: Gastroenterology

## 2024-07-14 ENCOUNTER — Ambulatory Visit: Admitting: Gastroenterology
